# Patient Record
Sex: Male | Born: 1960 | Race: Black or African American | Hispanic: No | Marital: Married | Smoking: Never smoker
Health system: Southern US, Community
[De-identification: ages and names within clinical notes are randomized; demographics above are authoritative.]

## PROBLEM LIST (undated history)

## (undated) DIAGNOSIS — I10 Essential (primary) hypertension: Secondary | ICD-10-CM

## (undated) DIAGNOSIS — M1990 Unspecified osteoarthritis, unspecified site: Secondary | ICD-10-CM

## (undated) DIAGNOSIS — M109 Gout, unspecified: Secondary | ICD-10-CM

## (undated) DIAGNOSIS — E785 Hyperlipidemia, unspecified: Secondary | ICD-10-CM

## (undated) DIAGNOSIS — H409 Unspecified glaucoma: Secondary | ICD-10-CM

## (undated) HISTORY — DX: Essential (primary) hypertension: I10

## (undated) HISTORY — DX: Unspecified osteoarthritis, unspecified site: M19.90

## (undated) HISTORY — DX: Gout, unspecified: M10.9

## (undated) HISTORY — PX: COLONOSCOPY: SHX174

## (undated) HISTORY — DX: Unspecified glaucoma: H40.9

## (undated) HISTORY — PX: POLYPECTOMY: SHX149

## (undated) HISTORY — DX: Hyperlipidemia, unspecified: E78.5

---

## 2010-02-27 ENCOUNTER — Emergency Department (HOSPITAL_COMMUNITY)
Admission: EM | Admit: 2010-02-27 | Discharge: 2010-02-27 | Payer: Self-pay | Source: Home / Self Care | Admitting: Family Medicine

## 2010-11-20 ENCOUNTER — Encounter: Payer: Self-pay | Admitting: *Deleted

## 2010-11-20 ENCOUNTER — Emergency Department (HOSPITAL_BASED_OUTPATIENT_CLINIC_OR_DEPARTMENT_OTHER)
Admission: EM | Admit: 2010-11-20 | Discharge: 2010-11-20 | Disposition: A | Discharge status: Home / Self Care | Payer: 59 | Attending: Emergency Medicine | Admitting: Emergency Medicine

## 2010-11-20 ENCOUNTER — Emergency Department (INDEPENDENT_AMBULATORY_CARE_PROVIDER_SITE_OTHER): Payer: 59

## 2010-11-20 DIAGNOSIS — M25469 Effusion, unspecified knee: Secondary | ICD-10-CM | POA: Insufficient documentation

## 2010-11-20 DIAGNOSIS — M25569 Pain in unspecified knee: Secondary | ICD-10-CM

## 2010-11-20 DIAGNOSIS — M25462 Effusion, left knee: Secondary | ICD-10-CM

## 2010-11-20 LAB — SYNOVIAL CELL COUNT + DIFF, W/ CRYSTALS
Lymphocytes-Synovial Fld: 2 % (ref 0–20)
Monocyte-Macrophage-Synovial Fluid: 30 % — ABNORMAL LOW (ref 50–90)

## 2010-11-20 MED ORDER — IBUPROFEN 800 MG PO TABS
800.0000 mg | ORAL_TABLET | Freq: Once | ORAL | Status: AC
  Administered 2010-11-20: 800 mg via ORAL
  Filled 2010-11-20: qty 1

## 2010-11-20 MED ORDER — LIDOCAINE-EPINEPHRINE 2 %-1:100000 IJ SOLN
INTRAMUSCULAR | Status: AC
  Administered 2010-11-20: 02:00:00
  Filled 2010-11-20: qty 1

## 2010-11-20 MED ORDER — IBUPROFEN 800 MG PO TABS: 800.0000 mg | ORAL_TABLET | Freq: Three times a day (TID) | ORAL | Status: AC

## 2010-11-20 NOTE — ED Notes (Signed)
Sterile dressing applied to left knee per MD order, pt tolerated well, dressing clean, dry, and intact.

## 2010-11-20 NOTE — ED Notes (Signed)
MD at bedside. 

## 2010-11-20 NOTE — ED Provider Notes (Signed)
History     CSN: [id number] Arrival date & time: 11/20/2010  1:23 AM  Chief Complaint  Patient presents with  . Joint Swelling    (Consider location/radiation/quality/duration/timing/severity/associated sxs/prior treatment) Patient is a 50 y.o. male presenting with knee pain. The history is provided by the patient.  Knee Pain This is a recurrent problem. The current episode started 2 days ago. The problem occurs constantly. The problem has been gradually worsening. Pertinent negatives include no chest pain, no abdominal pain, no headaches and no shortness of breath. The symptoms are aggravated by walking. The symptoms are relieved by position and rest. He has tried nothing for the symptoms. The treatment provided no relief.   location his left knee. 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 No radiation of pain. Duration and timing of being a waxing and waning but seems to be worsening. Severity is moderate. No associated fevers, vomiting, redness or warmth. Left knee is more swollen. No calf pain or swelling. Patient states that over year ago he had similar symptoms and was diagnosed with gout. He has not had any recurrence of these symptoms until now. It does not hurt to touch his knee. No trauma twisting or fall.  History reviewed. No pertinent past medical history.  History reviewed. No pertinent past surgical history.  History reviewed. No pertinent family history.  History  Substance Use Topics  . Smoking status: Never Smoker   . Smokeless tobacco: Not on file  . Alcohol Use: No      Review of Systems  Constitutional: Negative for fever and chills.  HENT: Negative for neck pain and neck stiffness.   Eyes: Negative for pain.  Respiratory: Negative for shortness of breath.   Cardiovascular: Negative for chest pain.  Gastrointestinal: Negative for abdominal pain.  Genitourinary: Negative for  dysuria.  Musculoskeletal: Negative for back pain and gait problem.  Skin: Negative for rash.  Neurological: Negative for headaches.  All other systems reviewed and are negative.    Allergies  Review of patient's allergies indicates no known allergies.  Home Medications  No current outpatient prescriptions on file.  BP 143/78  Pulse 65  Temp(Src) 98.5 F (36.9 C) (Oral)  Resp 18  SpO2 99%  Physical Exam  Constitutional: He is oriented to person, place, and time. He appears well-developed and well-nourished.  HENT:  Head: Normocephalic and atraumatic.  Eyes: Conjunctivae and EOM are normal. Pupils are equal, round, and reactive to light.  Neck: Trachea normal. Neck supple. No thyromegaly present.  Cardiovascular: Normal rate, regular rhythm, S1 normal, S2 normal and normal pulses.     No systolic murmur is present   No diastolic murmur is present  Pulses:      Radial pulses are 2+ on the right side, and 2+ on the left side.  Pulmonary/Chest: Effort normal and breath sounds normal. He has no wheezes. He has no rhonchi. He has no rales. He exhibits no tenderness.  Abdominal: Soft. Normal appearance and bowel sounds are normal. There is no tenderness. There is no CVA tenderness and negative Murphy's sign.  Musculoskeletal:       Left lower extremity: Mild tenderness and moderate effusion to the knee joint. There is no erythema or increased warmth to touch. No posterior knee tenderness or swelling. No calf tenderness or cords. Negative Homans. Distal neurovascular is intact. Hip is nontender to palpation.  Neurological: He is alert  and oriented to person, place, and time. He has normal strength. No cranial nerve deficit or sensory deficit. GCS eye subscore is 4. GCS verbal subscore is 5. GCS motor subscore is 6.  Skin: Skin is warm and dry. No rash noted. He is not diaphoretic.  Psychiatric: His speech is normal.       Cooperative and appropriate    ED Course   ARTHOCENTESIS Date/Time: 11/20/2010 2:52 AM Performed by: Sunnie Nielsen Authorized by: Sunnie Nielsen Consent: Verbal consent obtained. Risks and benefits: risks, benefits and alternatives were discussed Consent given by: patient Patient understanding: patient states understanding of the procedure being performed Patient consent: the patient's understanding of the procedure matches consent given Procedure consent: procedure consent matches procedure scheduled Relevant documents: relevant documents present and verified Test results: test results available and properly labeled Site marked: the operative site was marked Imaging studies: imaging studies available Patient identity confirmed: verbally with patient Time out: Immediately prior to procedure a "time out" was called to verify the correct patient, procedure, equipment, support staff and site/side marked as required. Indications: joint swelling, pain and diagnostic evaluation  Body area: knee Joint: left knee Local anesthesia used: yes Anesthesia: local infiltration Local anesthetic: lidocaine 1% with epinephrine Anesthetic total: 2 ml Preparation: Patient was prepped and draped in the usual sterile fashion. Needle gauge: 22 G Approach: lateral Aspirate: yellow Aspirate amount: 25 ml Patient tolerance: Patient tolerated the procedure well with no immediate complications. Comments: Joint fluid sent to the lab for evaluation   (including critical care time)   Labs Reviewed  SYNOVIAL FLUID, CRYSTAL  GRAM STAIN  BODY FLUID CULTURE  BODY FLUID CELL COUNT WITH DIFFERENTIAL   Dg Knee Complete 4 Views Left  11/20/2010  *RADIOLOGY REPORT*  Clinical Data: Left knee pain and swelling.  History of gout.  LEFT KNEE - COMPLETE 4+ VIEW  Comparison: None.  Findings: Mild degenerative narrowing and hypertrophic changes in the medial compartment of the left knee.  Moderate sized left knee effusion.  No bone destruction. No evidence of acute  fracture or subluxation.  No focal bone lesions.  Bone matrix and cortex appear intact.  No abnormal radiopaque densities in the soft tissues.  IMPRESSION: Moderate sized effusion.  Mild medial compartment degeneration.  No acute bony abnormalities demonstrated.  Original Report Authenticated By: Marlon Pel, M.D.    At 4:44am, I spoke with the lab regarding gram stain with reported results: no cells, no organisms. Culture pending. WBC 1800. Crystal analysis not available.   MDM   L knee swelling h/o gout. L knee arthrocentesis as above. Xray reviewed and Ibuprofen for pain. PCP referral and Rx NSAIDs with joint infection precautions provided.        Sunnie Nielsen, MD 11/20/10 [id number]

## 2010-11-20 NOTE — ED Notes (Signed)
Supplies to bedside per MD order for joint aspiration

## 2010-11-20 NOTE — ED Notes (Signed)
Joint aspiration performed by Dr Dierdre Highman, pt tolerated well.

## 2010-11-20 NOTE — ED Notes (Signed)
Pt c/o left knee swelling, no known injury

## 2010-11-20 NOTE — ED Notes (Signed)
Pt c/o left knee swelling and difficulty bending x 1 week. Denies any injury, has had similar problem in past which required fluid to be drained off knee.

## 2010-11-23 LAB — BODY FLUID CULTURE
Culture: NO GROWTH
Gram Stain: NONE SEEN

## 2010-11-25 ENCOUNTER — Ambulatory Visit: Payer: 59 | Admitting: Family Medicine

## 2011-10-03 ENCOUNTER — Encounter (HOSPITAL_BASED_OUTPATIENT_CLINIC_OR_DEPARTMENT_OTHER): Payer: Self-pay | Admitting: *Deleted

## 2011-10-03 ENCOUNTER — Emergency Department (HOSPITAL_BASED_OUTPATIENT_CLINIC_OR_DEPARTMENT_OTHER): Payer: 59

## 2011-10-03 ENCOUNTER — Emergency Department (HOSPITAL_BASED_OUTPATIENT_CLINIC_OR_DEPARTMENT_OTHER)
Admission: EM | Admit: 2011-10-03 | Discharge: 2011-10-04 | Disposition: A | Discharge status: Home / Self Care | Payer: 59 | Attending: Emergency Medicine | Admitting: Emergency Medicine

## 2011-10-03 DIAGNOSIS — R069 Unspecified abnormalities of breathing: Secondary | ICD-10-CM | POA: Insufficient documentation

## 2011-10-03 DIAGNOSIS — R0789 Other chest pain: Secondary | ICD-10-CM

## 2011-10-03 DIAGNOSIS — R071 Chest pain on breathing: Secondary | ICD-10-CM | POA: Insufficient documentation

## 2011-10-03 LAB — TROPONIN I: Troponin I: <0.3 ng/mL (ref <0.30)

## 2011-10-03 MED ORDER — IBUPROFEN 400 MG PO TABS
600.0000 mg | ORAL_TABLET | Freq: Once | ORAL | Status: AC
  Administered 2011-10-03: 600 mg via ORAL
  Filled 2011-10-03: qty 1

## 2011-10-03 NOTE — ED Provider Notes (Signed)
History     CSN: [id number]  Arrival date & time 10/03/11  2209   First MD Initiated Contact with Patient 10/03/11 2302      Chief Complaint  Patient presents with  . Abdominal Pain    (Consider location/radiation/quality/duration/timing/severity/associated sxs/prior treatment) Patient is a 51 y.o. male presenting with abdominal pain. The history is provided by the patient.  Abdominal Pain The primary symptoms of the illness include abdominal pain. The primary symptoms of the illness do not include fever, shortness of breath, nausea, vomiting or diarrhea.  Symptoms associated with the illness do not include chills, constipation, hematuria or back pain.  pt c/o dull pain to left costal margin area for past day. Constant. Non radiating. Worse w palpation, sitting in certain position for long while, turning torso. No relation to activity or exertion. Not pleuritic. No assoc sob. No nv or diaphoresis. No other recent cp or similar pain to area even w activity or exertion. Occasional non productive cough. No fever or chills. No unusual doe or fatigue. No fall, or direct trauma to area, but states his work involves a lot of heavy lifting and he feels he may have strained something. No abd pain. No nv. Normal appetite. No gu c/o. No back/flank pain. Denies leg pain or swelling. No immobility, trauma, surgery, or ca hx. No dvt or pe hx. No pleuritic pain.   History reviewed. No pertinent past medical history.  History reviewed. No pertinent past surgical history.  No family history on file.  History  Substance Use Topics  . Smoking status: Never Smoker   . Smokeless tobacco: Not on file  . Alcohol Use: No      Review of Systems  Constitutional: Negative for fever and chills.  HENT: Negative for neck pain.   Eyes: Negative for redness.  Respiratory: Negative for shortness of breath.   Cardiovascular: Negative for chest pain and leg swelling.  Gastrointestinal: Positive for abdominal  pain. Negative for nausea, vomiting, diarrhea and constipation.  Genitourinary: Negative for hematuria and flank pain.  Musculoskeletal: Negative for back pain.  Skin: Negative for rash.  Neurological: Negative for headaches.  Hematological: Does not bruise/bleed easily.  Psychiatric/Behavioral: Negative for confusion.    Allergies  Review of patient's allergies indicates no known allergies.  Home Medications  No current outpatient prescriptions on file.  BP 154/90  Pulse 65  Temp 97.9 F (36.6 C) (Oral)  Resp 20  SpO2 99%  Physical Exam  Nursing note and vitals reviewed. Constitutional: He is oriented to person, place, and time. He appears well-developed and well-nourished. No distress.  HENT:  Head: Atraumatic.  Eyes: Conjunctivae are normal. No scleral icterus.  Neck: Neck supple. No tracheal deviation present.  Cardiovascular: Normal rate, regular rhythm, normal heart sounds and intact distal pulses.   Pulmonary/Chest: Effort normal and breath sounds normal. No accessory muscle usage. No respiratory distress. He exhibits tenderness.       Left lower chest wall tenderness reproducing symptoms, no crepitus.   Abdominal: Soft. Bowel sounds are normal. He exhibits no distension and no mass. There is no tenderness. There is no rebound and no guarding.  Genitourinary:       No cva tenderness  Musculoskeletal: Normal range of motion. He exhibits no edema and no tenderness.  Neurological: He is alert and oriented to person, place, and time.  Skin: Skin is warm and dry.  Psychiatric: He has a normal mood and affect.    ED Course  Procedures (including critical care  time)   Labs Reviewed  TROPONIN I    Results for orders placed during the hospital encounter of 10/03/11  TROPONIN I      Component Value Range   Troponin I <0.30  <0.30 ng/mL   Dg Chest 2 View  10/03/2011  *RADIOLOGY REPORT*  Clinical Data: Chest pain.  CHEST - 2 VIEW  Comparison: None.  Findings:  Cardiomediastinal silhouette appears normal.  No acute pulmonary disease is noted.  Bony thorax is intact.  IMPRESSION: No acute cardiopulmonary abnormality seen.  Original Report Authenticated By: Venita Sheffield., M.D.      MDM  Cxr. Labs. Motrin po.      Date: 10/03/2011  Rate: 60  Rhythm: normal sinus rhythm  QRS Axis: normal  Intervals: normal  ST/T Wave abnormalities: nonspecific T wave changes  Conduction Disutrbances:none  Narrative Interpretation:   Old EKG Reviewed: none available  Pain seems musculoskeletal in nature. Pt feels as if may have strained/pulled muscle.  After having constant symptoms x > 24 hours, trop negative.    Will rx pain, pcp f/u.     Suzi Roots, MD 10/04/11 Marlyne Beards

## 2011-10-03 NOTE — ED Notes (Signed)
Pain in his left upper abdominal quadrant. States he thinks he pulled a muscle at work.

## 2011-10-04 MED ORDER — IBUPROFEN 600 MG PO TABS: 600.0000 mg | ORAL_TABLET | Freq: Four times a day (QID) | ORAL | Status: AC | PRN

## 2011-10-04 MED ORDER — HYDROCODONE-ACETAMINOPHEN 5-500 MG PO TABS: 1.0000 | ORAL_TABLET | Freq: Four times a day (QID) | ORAL | Status: AC | PRN

## 2012-04-01 ENCOUNTER — Ambulatory Visit: Payer: 59 | Admitting: Internal Medicine

## 2012-04-30 ENCOUNTER — Ambulatory Visit (INDEPENDENT_AMBULATORY_CARE_PROVIDER_SITE_OTHER): Payer: 59 | Admitting: Family Medicine

## 2012-04-30 ENCOUNTER — Encounter: Payer: Self-pay | Admitting: Family Medicine

## 2012-04-30 VITALS — BP 148/98 | HR 80 | Temp 98.0°F | Resp 12 | Ht 74.75 in | Wt 316.0 lb

## 2012-04-30 DIAGNOSIS — I10 Essential (primary) hypertension: Secondary | ICD-10-CM

## 2012-04-30 DIAGNOSIS — Z23 Encounter for immunization: Secondary | ICD-10-CM

## 2012-04-30 DIAGNOSIS — E785 Hyperlipidemia, unspecified: Secondary | ICD-10-CM | POA: Insufficient documentation

## 2012-04-30 DIAGNOSIS — M109 Gout, unspecified: Secondary | ICD-10-CM

## 2012-04-30 DIAGNOSIS — Z0000 Encounter for general adult medical examination without abnormal findings: Secondary | ICD-10-CM

## 2012-04-30 LAB — BASIC METABOLIC PANEL
Calcium: 8.7 mg/dL (ref 8.4–10.5)
GFR: 92.45 mL/min (ref >60.00)
Glucose, Bld: 95 mg/dL (ref 70–99)
Sodium: 142 mEq/L (ref 135–145)

## 2012-04-30 LAB — HEPATIC FUNCTION PANEL
ALT: 25 U/L (ref 0–53)
Albumin: 3.8 g/dL (ref 3.5–5.2)
Total Bilirubin: 0.8 mg/dL (ref 0.3–1.2)
Total Protein: 7 g/dL (ref 6.0–8.3)

## 2012-04-30 LAB — CBC WITH DIFFERENTIAL/PLATELET
Basophils Relative: 0.5 % (ref 0.0–3.0)
Eosinophils Absolute: 0.1 10*3/uL (ref 0.0–0.7)
HCT: 42.2 % (ref 39.0–52.0)
Lymphs Abs: 2.2 10*3/uL (ref 0.7–4.0)
MCHC: 33.1 g/dL (ref 30.0–36.0)
MCV: 86.3 fl (ref 78.0–100.0)
Monocytes Absolute: 0.6 10*3/uL (ref 0.1–1.0)
Neutro Abs: 2.4 10*3/uL (ref 1.4–7.7)
Neutrophils Relative %: 44.8 % (ref 43.0–77.0)
RBC: 4.89 Mil/uL (ref 4.22–5.81)

## 2012-04-30 LAB — PSA: PSA: 0.96 ng/mL (ref 0.10–4.00)

## 2012-04-30 LAB — LIPID PANEL: HDL: 41.5 mg/dL (ref >39.00)

## 2012-04-30 MED ORDER — AMLODIPINE BESYLATE 5 MG PO TABS: 5.0000 mg | ORAL_TABLET | Freq: Every day | ORAL | Status: DC

## 2012-04-30 NOTE — Patient Instructions (Addendum)

## 2012-04-30 NOTE — Progress Notes (Signed)
  Subjective:    Patient ID: Eric Mann, male    DOB: 07-Dec-1960, 52 y.o.   MRN: [id number]  HPI New patient.  No primary care and several years. History of gout which is been treated sporadically. At one point was on prophylaxis with allopurinol. Generally has about 3 episodes per year. Previous episodes have involved knee and hands  Patient currently takes no medications. Previously was treated for hypertension. Cannot recall medication. No history of screening colonoscopy. No lab work in several years  No prior surgeries. No known allergies. Family history significant for several members with diabetes. Mother had heart disease but he is not sure of the extent. Mother also had hypertension. Father died of accidental death early in life  Patient has 10 children. He is married. Nonsmoker. No alcohol use    Review of Systems  Constitutional: Negative for fever, activity change, appetite change and fatigue.  HENT: Negative for ear pain, congestion and trouble swallowing.   Eyes: Negative for pain and visual disturbance.  Respiratory: Negative for cough, shortness of breath and wheezing.   Cardiovascular: Negative for chest pain and palpitations.  Gastrointestinal: Negative for nausea, vomiting, abdominal pain, diarrhea, constipation, blood in stool, abdominal distention and rectal pain.  Genitourinary: Negative for dysuria, hematuria, discharge and testicular pain.  Musculoskeletal: Positive for arthralgias. Negative for joint swelling.  Skin: Negative for rash.  Neurological: Negative for dizziness, syncope and headaches.  Hematological: Negative for adenopathy.  Psychiatric/Behavioral: Negative for confusion and dysphoric mood.       Objective:   Physical Exam  Constitutional: He is oriented to person, place, and time. He appears well-developed and well-nourished. No distress.  HENT:  Head: Normocephalic and atraumatic.  Right Ear: External ear normal.  Left Ear:  External ear normal.  Mouth/Throat: Oropharynx is clear and moist.  Eyes: Conjunctivae and EOM are normal. Pupils are equal, round, and reactive to light.  Neck: Normal range of motion. Neck supple. No thyromegaly present.  Cardiovascular: Normal rate, regular rhythm and normal heart sounds.   No murmur heard. Pulmonary/Chest: No respiratory distress. He has no wheezes. He has no rales.  Abdominal: Soft. Bowel sounds are normal. He exhibits no distension and no mass. There is no tenderness. There is no rebound and no guarding.  Genitourinary: Rectum normal and prostate normal.  Musculoskeletal: He exhibits no edema.  Lymphadenopathy:    He has no cervical adenopathy.  Neurological: He is alert and oriented to person, place, and time. He displays normal reflexes. No cranial nerve deficit.  Skin: No rash noted.  Psychiatric: He has a normal mood and affect.          Assessment & Plan:  Complete physical. Obtain screening labs. Tetanus booster given. Set up screening colonoscopy. Discussed importance of regular exercise and weight loss. Needs glucose screening with very strong family history of diabetes  Hypertension. Currently untreated. Start amlodipine 5 mg daily and reassess one month

## 2012-05-03 ENCOUNTER — Encounter: Payer: Self-pay | Admitting: Family Medicine

## 2012-05-03 LAB — LDL CHOLESTEROL, DIRECT: Direct LDL: 145.2 mg/dL

## 2012-05-05 ENCOUNTER — Encounter: Payer: Self-pay | Admitting: Internal Medicine

## 2012-05-17 ENCOUNTER — Ambulatory Visit: Payer: 59 | Admitting: Internal Medicine

## 2012-05-28 ENCOUNTER — Ambulatory Visit (INDEPENDENT_AMBULATORY_CARE_PROVIDER_SITE_OTHER): Payer: 59 | Admitting: Family Medicine

## 2012-05-28 ENCOUNTER — Encounter: Payer: Self-pay | Admitting: Family Medicine

## 2012-05-28 VITALS — BP 122/80 | Temp 97.8°F | Wt 313.0 lb

## 2012-05-28 DIAGNOSIS — I10 Essential (primary) hypertension: Secondary | ICD-10-CM

## 2012-05-28 NOTE — Progress Notes (Signed)
  Subjective:    Patient ID: Eric Mann, male    DOB: [birth year], 52 y.o.   MRN: [id number]  HPI   Followup hypertension We started amlodipine last visit. Tolerating well no side effects. Blood pressure was 148/98 last visit. He has made some dietary changes and has lost about 3 or 4 pounds. Compliant with therapy.  Trying to watch sodium intake carefully. No alcohol consumption. No regular nonsteroidals.  Past Medical History  Diagnosis Date  . Gout   . Hypertension   . Gout    No past surgical history on file.  reports that he has never smoked. He does not have any smokeless tobacco history on file. He reports that he does not drink alcohol or use illicit drugs. family history includes Cancer in his sister; Diabetes in his brother, mother, and sister; Heart disease in his mother; and Hypertension in his mother. No Known Allergies    Review of Systems  Constitutional: Negative for fatigue.  Eyes: Negative for visual disturbance.  Respiratory: Negative for cough, chest tightness and shortness of breath.   Cardiovascular: Negative for chest pain, palpitations and leg swelling.  Neurological: Negative for dizziness, syncope, weakness, light-headedness and headaches.       Objective:   Physical Exam  Constitutional: He appears well-developed and well-nourished.  Neck: Neck supple. No thyromegaly present.  Cardiovascular: Normal rate and regular rhythm.   Pulmonary/Chest: Effort normal and breath sounds normal. No respiratory distress. He has no wheezes. He has no rales.  Musculoskeletal: He exhibits no edema.          Assessment & Plan:  Hypertension. Improved. Continue amlodipine. Routine followup 6 months. Continue exercise and weight loss efforts.

## 2012-06-02 ENCOUNTER — Encounter: Payer: Self-pay | Admitting: Internal Medicine

## 2012-06-02 ENCOUNTER — Ambulatory Visit (AMBULATORY_SURGERY_CENTER): Payer: 59 | Admitting: *Deleted

## 2012-06-02 VITALS — Ht 75.0 in | Wt 313.8 lb

## 2012-06-02 DIAGNOSIS — Z1211 Encounter for screening for malignant neoplasm of colon: Secondary | ICD-10-CM

## 2012-06-02 MED ORDER — MOVIPREP 100 G PO SOLR: 1.0000 | Freq: Once | ORAL | Status: DC

## 2012-06-02 NOTE — Progress Notes (Signed)
Denies any allergies to eggs or soy products. Denies any complications with sedation or anesthesia.

## 2012-06-16 ENCOUNTER — Ambulatory Visit (AMBULATORY_SURGERY_CENTER): Payer: 59 | Admitting: Internal Medicine

## 2012-06-16 ENCOUNTER — Encounter: Payer: Self-pay | Admitting: Internal Medicine

## 2012-06-16 VITALS — BP 122/78 | HR 54 | Temp 96.4°F | Resp 22 | Ht 75.0 in | Wt 313.0 lb

## 2012-06-16 DIAGNOSIS — D126 Benign neoplasm of colon, unspecified: Secondary | ICD-10-CM

## 2012-06-16 DIAGNOSIS — Z1211 Encounter for screening for malignant neoplasm of colon: Secondary | ICD-10-CM

## 2012-06-16 MED ORDER — SODIUM CHLORIDE 0.9 % IV SOLN: 500.0000 mL | INTRAVENOUS | Status: DC

## 2012-06-16 NOTE — Op Note (Signed)
[facility] [address] [place], [address]   COLONOSCOPY PROCEDURE REPORT  PATIENT: Eric Mann, Eric Mann  MR#: [id number] BIRTHDATE: April 09, 1960 , 51  yrs. old GENDER: Male ENDOSCOPIST: Roxy Cedar, MD REFERRED XB:JYNWG Burchette, M.D. PROCEDURE DATE:  06/16/2012 PROCEDURE:   Colonoscopy with snare polypectomy    x 3 ASA CLASS:   Class II INDICATIONS:average risk screening. MEDICATIONS: MAC sedation, administered by CRNA and propofol (Diprivan) 500mg  IV  DESCRIPTION OF PROCEDURE:   After the risks benefits and alternatives of the procedure were thoroughly explained, informed consent was obtained.  A digital rectal exam revealed no abnormalities of the rectum.   The LB [id number]  endoscope was introduced through the anus and advanced to the cecum, which was identified by both the appendix and ileocecal valve. No adverse events experienced.   The quality of the prep was excellent, using MoviPrep  The instrument was then slowly withdrawn as the colon was fully examined.      COLON FINDINGS: Three diminutive polyps were found in the ascending (2) and transverse colon.  A polypectomy was performed with a cold snare.  The resection was complete and the polyp tissue was completely retrieved.   The colon mucosa was otherwise normal. Retroflexed views revealed no abnormalities. The time to cecum=2 minutes 18 seconds.  Withdrawal time=19 minutes 08 seconds.  The scope was withdrawn and the procedure completed. COMPLICATIONS: There were no complications.  ENDOSCOPIC IMPRESSION: 1.   Three diminutive polyps were found in the ascending (2) and transverse colon; polypectomy was performed with a cold snare 2.   The colon mucosa was otherwise normal  RECOMMENDATIONS: 1. Follow up colonoscopy in 5 years   eSigned:  Roxy Cedar, MD 06/16/2012 8:47 AM   cc: Evelena Peat, MD and The Patient   PATIENT NAME:  Eric Mann, Eric Mann MR#: [id number]

## 2012-06-16 NOTE — Progress Notes (Addendum)
Patient did not have preoperative order for IV antibiotic SSI prophylaxis. (G8918)  Patient did not experience any of the following events: a burn prior to discharge; a fall within the facility; wrong site/side/patient/procedure/implant event; or a hospital transfer or hospital admission upon discharge from the facility. (G8907)  

## 2012-06-16 NOTE — Patient Instructions (Addendum)
YOU HAD AN ENDOSCOPIC PROCEDURE TODAY AT THE [facility]: Refer to the procedure report that was given to you for any specific questions about what was found during the examination.  If the procedure report does not answer your questions, please call your gastroenterologist to clarify.  If you requested that your care partner not be given the details of your procedure findings, then the procedure report has been included in a sealed envelope for you to review at your convenience later.  YOU SHOULD EXPECT: Some feelings of bloating in the abdomen. Passage of more gas than usual.  Walking can help get rid of the air that was put into your GI tract during the procedure and reduce the bloating. If you had a lower endoscopy (such as a colonoscopy or flexible sigmoidoscopy) you may notice spotting of blood in your stool or on the toilet paper. If you underwent a bowel prep for your procedure, then you may not have a normal bowel movement for a few days.  DIET: Your first meal following the procedure should be a light meal and then it is ok to progress to your normal diet.  A half-sandwich or bowl of soup is an example of a good first meal.  Heavy or fried foods are harder to digest and may make you feel nauseous or bloated.  Likewise meals heavy in dairy and vegetables can cause extra gas to form and this can also increase the bloating.  Drink plenty of fluids but you should avoid alcoholic beverages for 24 hours.  ACTIVITY: Your care partner should take you home directly after the procedure.  You should plan to take it easy, moving slowly for the rest of the day.  You can resume normal activity the day after the procedure however you should NOT DRIVE or use heavy machinery for 24 hours (because of the sedation medicines used during the test).    SYMPTOMS TO REPORT IMMEDIATELY: A gastroenterologist can be reached at any hour.  During normal business hours, 8:30 AM to 5:00 PM Monday through Friday,  call [phone].  After hours and on weekends, please call the GI answering service at [phone] who will take a message and have the physician on call contact you.   Following lower endoscopy (colonoscopy or flexible sigmoidoscopy):  Excessive amounts of blood in the stool  Significant tenderness or worsening of abdominal pains  Swelling of the abdomen that is new, acute  Fever of 100F or higher FOLLOW UP: If any biopsies were taken you will be contacted by phone or by letter within the next 1-3 weeks.  Call your gastroenterologist if you have not heard about the biopsies in 3 weeks.  Our staff will call the home number listed on your records the next business day following your procedure to check on you and address any questions or concerns that you may have at that time regarding the information given to you following your procedure. This is a courtesy call and so if there is no answer at the home number and we have not heard from you through the emergency physician on call, we will assume that you have returned to your regular daily activities without incident.  YOUR NEXT COLONOSCOPY SHOULD BE IN 5 YRS.  SIGNATURES/CONFIDENTIALITY: You and/or your care partner have signed paperwork which will be entered into your electronic medical record.  These signatures attest to the fact that that the information above on your After Visit Summary has been reviewed and is understood.  Full responsibility of the confidentiality of this discharge information lies with you and/or your care-partner.

## 2012-06-17 ENCOUNTER — Telehealth: Payer: Self-pay | Admitting: *Deleted

## 2012-06-17 NOTE — Telephone Encounter (Signed)
  Follow up Call-  Call back number 06/16/2012  Post procedure Call Back phone  # cell [phone]  Permission to leave phone message Yes     Patient questions:  Do you have a fever, pain , or abdominal swelling? no Pain Score  0 *  Have you tolerated food without any problems? yes  Have you been able to return to your normal activities? yes  Do you have any questions about your discharge instructions: Diet   no Medications  no Follow up visit  no  Do you have questions or concerns about your Care? no  Actions: * If pain score is 4 or above: No action needed, pain <4.

## 2012-06-23 ENCOUNTER — Encounter: Payer: Self-pay | Admitting: Internal Medicine

## 2012-09-24 ENCOUNTER — Encounter: Payer: Self-pay | Admitting: Family Medicine

## 2012-09-24 ENCOUNTER — Emergency Department (HOSPITAL_COMMUNITY)
Admission: EM | Admit: 2012-09-24 | Discharge: 2012-09-24 | Disposition: A | Discharge status: Home / Self Care | Payer: 59 | Attending: Emergency Medicine | Admitting: Emergency Medicine

## 2012-09-24 ENCOUNTER — Encounter (HOSPITAL_COMMUNITY): Payer: Self-pay | Admitting: Emergency Medicine

## 2012-09-24 ENCOUNTER — Emergency Department (HOSPITAL_COMMUNITY): Payer: 59

## 2012-09-24 ENCOUNTER — Ambulatory Visit (INDEPENDENT_AMBULATORY_CARE_PROVIDER_SITE_OTHER): Payer: 59 | Admitting: Family Medicine

## 2012-09-24 VITALS — BP 120/84 | Temp 99.0°F | Wt 311.0 lb

## 2012-09-24 DIAGNOSIS — M25522 Pain in left elbow: Secondary | ICD-10-CM

## 2012-09-24 DIAGNOSIS — I10 Essential (primary) hypertension: Secondary | ICD-10-CM | POA: Insufficient documentation

## 2012-09-24 DIAGNOSIS — R110 Nausea: Secondary | ICD-10-CM

## 2012-09-24 DIAGNOSIS — R5381 Other malaise: Secondary | ICD-10-CM

## 2012-09-24 DIAGNOSIS — M25529 Pain in unspecified elbow: Secondary | ICD-10-CM

## 2012-09-24 DIAGNOSIS — M25429 Effusion, unspecified elbow: Secondary | ICD-10-CM | POA: Insufficient documentation

## 2012-09-24 DIAGNOSIS — R509 Fever, unspecified: Secondary | ICD-10-CM | POA: Insufficient documentation

## 2012-09-24 DIAGNOSIS — M109 Gout, unspecified: Secondary | ICD-10-CM | POA: Insufficient documentation

## 2012-09-24 LAB — CBC WITH DIFFERENTIAL/PLATELET
Eosinophils Absolute: 0 10*3/uL (ref 0.0–0.7)
Eosinophils Relative: 0 % (ref 0–5)
HCT: 38.4 % — ABNORMAL LOW (ref 39.0–52.0)
Hemoglobin: 13.6 g/dL (ref 13.0–17.0)
Lymphocytes Relative: 14 % (ref 12–46)
Lymphs Abs: 1.4 10*3/uL (ref 0.7–4.0)
MCH: 29.6 pg (ref 26.0–34.0)
MCV: 83.7 fL (ref 78.0–100.0)
Monocytes Absolute: 1.2 10*3/uL — ABNORMAL HIGH (ref 0.1–1.0)
Monocytes Relative: 12 % (ref 3–12)
Platelets: 169 10*3/uL (ref 150–400)
RBC: 4.59 MIL/uL (ref 4.22–5.81)
WBC: 10.1 10*3/uL (ref 4.0–10.5)

## 2012-09-24 LAB — SYNOVIAL CELL COUNT + DIFF, W/ CRYSTALS
Crystals, Fluid: POSITIVE
Lymphocytes-Synovial Fld: 0 % (ref 0–20)
Monocyte-Macrophage-Synovial Fluid: 10 % — ABNORMAL LOW (ref 50–90)
WBC, Synovial: 124918 /mm3 — ABNORMAL HIGH (ref 0–200)

## 2012-09-24 LAB — BASIC METABOLIC PANEL
BUN: 11 mg/dL (ref 6–23)
CO2: 24 mEq/L (ref 19–32)
Calcium: 8.7 mg/dL (ref 8.4–10.5)
Glucose, Bld: 111 mg/dL — ABNORMAL HIGH (ref 70–99)
Sodium: 139 mEq/L (ref 135–145)

## 2012-09-24 LAB — GRAM STAIN

## 2012-09-24 MED ORDER — OXYCODONE-ACETAMINOPHEN 5-325 MG PO TABS: 2.0000 | ORAL_TABLET | ORAL | Status: DC | PRN

## 2012-09-24 MED ORDER — ONDANSETRON HCL 4 MG/2ML IJ SOLN
4.0000 mg | Freq: Once | INTRAMUSCULAR | Status: AC
  Administered 2012-09-24: 4 mg via INTRAVENOUS
  Filled 2012-09-24: qty 2

## 2012-09-24 MED ORDER — PREDNISONE 20 MG PO TABS: ORAL_TABLET | ORAL | Status: DC

## 2012-09-24 MED ORDER — MORPHINE SULFATE 4 MG/ML IJ SOLN
4.0000 mg | Freq: Once | INTRAMUSCULAR | Status: AC
  Administered 2012-09-24: 4 mg via INTRAVENOUS
  Filled 2012-09-24: qty 1

## 2012-09-24 MED ORDER — METHYLPREDNISOLONE SODIUM SUCC 125 MG IJ SOLR
125.0000 mg | Freq: Once | INTRAMUSCULAR | Status: AC
  Administered 2012-09-24: 125 mg via INTRAVENOUS
  Filled 2012-09-24: qty 2

## 2012-09-24 NOTE — ED Notes (Signed)
Lab at beside.

## 2012-09-24 NOTE — ED Provider Notes (Signed)
CSN: [id number]     Arrival date & time 09/24/12  6578 History     First MD Initiated Contact with Patient 09/24/12 [phone]     No chief complaint on file.  (Consider location/radiation/quality/duration/timing/severity/associated sxs/prior Treatment) HPI Comments: Patient presents to the ER for evaluation of moderate pain and swelling of his left elbow. Patient reports that the symptoms began 2 or 3 days ago. Initially it was a small amount of pain, but the pain continually worsened. He denies injury to the area. Last night he had a fever. He saw his doctor today and was sent to the ER to rule out infection in the joint. Patient has not had any other symptoms such as upper respiratory infection symptoms or abdominal symptoms to explain fever.   Past Medical History  Diagnosis Date  . Gout   . Hypertension   . Gout    No past surgical history on file. Family History  Problem Relation Age of Onset  . Heart disease Mother   . Hypertension Mother   . Diabetes Mother   . Diabetes Sister   . Cancer Sister     cervix cancer  . Diabetes Brother   . Colon cancer Neg Hx   . Esophageal cancer Neg Hx   . Rectal cancer Neg Hx   . Stomach cancer Neg Hx    History  Substance Use Topics  . Smoking status: Never Smoker   . Smokeless tobacco: Never Used  . Alcohol Use: No    Review of Systems  Constitutional: Positive for fever.  Musculoskeletal: Positive for joint swelling.  All other systems reviewed and are negative.    Allergies  Review of patient's allergies indicates no known allergies.  Home Medications   Current Outpatient Rx  Name  Route  Sig  Dispense  Refill  . acetaminophen (TYLENOL) 500 MG tablet   Oral   Take 1,000 mg by mouth every 6 (six) hours as needed for pain.         Marland Kitchen amLODipine (NORVASC) 5 MG tablet   Oral   Take 1 tablet (5 mg total) by mouth daily.   30 tablet   11    BP 137/88  Pulse 79  Temp(Src) 98.5 F (36.9 C) (Oral)  Resp 20  SpO2  98% Physical Exam  Constitutional: He is oriented to person, place, and time. He appears well-developed and well-nourished. No distress.  HENT:  Head: Normocephalic and atraumatic.  Right Ear: Hearing normal.  Left Ear: Hearing normal.  Nose: Nose normal.  Mouth/Throat: Oropharynx is clear and moist and mucous membranes are normal.  Eyes: Conjunctivae and EOM are normal. Pupils are equal, round, and reactive to light.  Neck: Normal range of motion. Neck supple.  Cardiovascular: Regular rhythm, S1 normal and S2 normal.  Exam reveals no gallop and no friction rub.   No murmur heard. Pulmonary/Chest: Effort normal and breath sounds normal. No respiratory distress. He exhibits no tenderness.  Abdominal: Soft. Normal appearance and bowel sounds are normal. There is no hepatosplenomegaly. There is no tenderness. There is no rebound, no guarding, no tenderness at McBurney's point and negative Murphy's sign. No hernia.  Musculoskeletal: Normal range of motion.       Left elbow: He exhibits swelling and effusion. He exhibits normal range of motion and no deformity. Tenderness (diffuse) found.  Neurological: He is alert and oriented to person, place, and time. He has normal strength. No cranial nerve deficit or sensory deficit. Coordination normal. GCS eye subscore  is 4. GCS verbal subscore is 5. GCS motor subscore is 6.  Skin: Skin is warm, dry and intact. No rash noted. No cyanosis.  Psychiatric: He has a normal mood and affect. His speech is normal and behavior is normal. Thought content normal.    ED Course   Procedures (including critical care time)  Procedure: Arthrocentesis The elbow was positioned at 90 degrees after landmarks were identified and the area was prepped with Betadine and sterilely draped. 1% lidocaine was injected under the skin to raise a large skin wheal for skin anesthesia. An 21-gauge needle was then advanced through the anesthetized skin with constant aspiration on a 10mL  syringe. The needle was advanced into the joint capsule and synovial fluid was aspirated until there was no more return. 2 mL of cloudy fluid was removed. The needle was removed and a sterile dressing was held in place until bleeding stopped, and the Band-Aid was placed over the site. The patient tolerated this procedure well. There were no complications.  Labs Reviewed  CBC WITH DIFFERENTIAL - Abnormal; Notable for the following:    HCT 38.4 (*)    Monocytes Absolute 1.2 (*)    All other components within normal limits  BASIC METABOLIC PANEL - Abnormal; Notable for the following:    Potassium 3.1 (*)    Glucose, Bld 111 (*)    GFR calc non Af Amer 75 (*)    GFR calc Af Amer 87 (*)    All other components within normal limits  CELL COUNT + DIFF,  W/ CRYST-SYNVL FLD - Abnormal; Notable for the following:    Appearance-Synovial TURBID (*)    WBC, Synovial 124918 (*)    Neutrophil, Synovial 90 (*)    Monocyte-Macrophage-Synovial Fluid 10 (*)    All other components within normal limits  GRAM STAIN  CULTURE, BLOOD (ROUTINE X 2)  CULTURE, BLOOD (ROUTINE X 2)  BODY FLUID CULTURE  SEDIMENTATION RATE  C-REACTIVE PROTEIN   Dg Elbow Complete Left  09/24/2012   *RADIOLOGY REPORT*  Clinical Data: Pain with swelling and redness; fever  LEFT ELBOW - COMPLETE 3+ VIEW  Comparison: None.  Findings: Frontal, lateral, bilateral oblique views were obtained. There is a sizable joint effusion.  There is no demonstrable fracture or dislocation.  There is a prominent spur arising from the olecranon.  There is no appreciable joint space narrowing.  No erosive change or bony destruction.  IMPRESSION: Sizable joint effusion.  In the absence of trauma, this finding could indicate inflammatory etiology. No erosive change or bony destruction is seen.  Correlation with arthrocentesis may be reasonable if there is infection suspected clinically. Alternatively, MR may be helpful to assess for marrow edema or possibly  subtle bony destruction not seen on radiographic examination.  No fracture or dislocation identified.  There is a prominent olecranon spur.   Original Report Authenticated By: Bretta Bang, M.D.   Diagnosis: Gout  MDM  Patient presents to the ER for evaluation of this pain and swelling of the left elbow. Patient reports a history of gout, but the pain he is experiencing elbow is different. His has had low-grade fever last night. This raises concern for infectious etiology. I didn't have via joint effusion on exam. X-ray confirmed. I did recommend arthrocentesis for diagnostic purposes. Patient has had this done as he before, understands the procedure. He did give consent. Procedure was done without difficulty. Review of the synovial fluid reveals crystals consistent with gout.  Gilda Crease, MD 09/24/12  1149 

## 2012-09-24 NOTE — Progress Notes (Signed)
Chief Complaint  Patient presents with  . left elbow pain    HPI:  Acute visit for L elbow pain: -started 2 days ago -symptoms: pain, swelling, redness in L elbow, also has felt feverish, HA - felt hot last night and per wife had a high fever, has been having nausea, malaise and feel quite sick this morning -has had gout in the past, but usually does not feel sick with gout and reports felt sicker last night and feels sick today -feels like can not bend L elbow due to swelling and pain  ROS: See pertinent positives and negatives per HPI.  Past Medical History  Diagnosis Date  . Gout   . Hypertension   . Gout     Family History  Problem Relation Age of Onset  . Heart disease Mother   . Hypertension Mother   . Diabetes Mother   . Diabetes Sister   . Cancer Sister     cervix cancer  . Diabetes Brother   . Colon cancer Neg Hx   . Esophageal cancer Neg Hx   . Rectal cancer Neg Hx   . Stomach cancer Neg Hx     History   Social History  . Marital Status: Married    Spouse Name: N/A    Number of Children: N/A  . Years of Education: N/A   Social History Main Topics  . Smoking status: Never Smoker   . Smokeless tobacco: Never Used  . Alcohol Use: No  . Drug Use: No  . Sexually Active: None   Other Topics Concern  . None   Social History Narrative  . None    Current outpatient prescriptions:amLODipine (NORVASC) 5 MG tablet, Take 1 tablet (5 mg total) by mouth daily., Disp: 30 tablet, Rfl: 11  EXAM:  Filed Vitals:   09/24/12 0757  BP: 120/84  Temp: 99 F (37.2 C)    Body mass index is 38.87 kg/(m^2).  GENERAL: vitals reviewed and listed above, alert, oriented, appears well hydrated and in no acute distress  HEENT: atraumatic, conjunttiva clear, no obvious abnormalities on inspection of external nose and ears  NECK: no obvious masses on inspection  LUNGS: clear to auscultation bilaterally, no wheezes, rales or rhonchi, good air movement  CV: HRRR, no  peripheral edema  MS: L elbow with effusion and diffuse TTP - pt refuses to move this elbow  PSYCH: pleasant and cooperative, no obvious depression or anxiety  ASSESSMENT AND PLAN:  Discussed the following assessment and plan:  Elbow pain, left  Malaise  Nausea  Fever  -pt with severe elbow pain, swelling and warmth, refusing to move elbow with low grade fever here and systemic symptoms of fever, malaise, HA, nausea. While he has a hx of possible gout and this is a possible etiology discussed possibility of septic joint. Advised to go to emergency room for urgent eval, tap, labs, tx if septic. ED notified. -Patient advised to return or notify a doctor immediately if symptoms worsen or persist or new concerns arise.  There are no Patient Instructions on file for this visit.   Kriste Basque R.

## 2012-09-24 NOTE — ED Notes (Signed)
Pt returned from radiology.

## 2012-09-24 NOTE — ED Notes (Signed)
Pt sts he was sent over from his PCP office for possible infection in left elbow. sts he has hx of gout but this time he has a fever with it. Pt in nad, skin warm and dry, resp e/u. Pt has swelling to left elbow.

## 2012-09-27 LAB — BODY FLUID CULTURE: Culture: NO GROWTH

## 2012-09-30 LAB — CULTURE, BLOOD (ROUTINE X 2)
Culture: NO GROWTH
Culture: NO GROWTH

## 2012-11-03 ENCOUNTER — Encounter: Payer: Self-pay | Admitting: Family Medicine

## 2012-11-03 ENCOUNTER — Ambulatory Visit (INDEPENDENT_AMBULATORY_CARE_PROVIDER_SITE_OTHER): Payer: 59 | Admitting: Family Medicine

## 2012-11-03 VITALS — BP 130/90 | HR 58 | Wt 312.0 lb

## 2012-11-03 DIAGNOSIS — M109 Gout, unspecified: Secondary | ICD-10-CM

## 2012-11-03 MED ORDER — COLCHICINE 0.6 MG PO TABS: 0.6000 mg | ORAL_TABLET | Freq: Every day | ORAL | Status: DC

## 2012-11-03 MED ORDER — PREDNISONE 20 MG PO TABS: ORAL_TABLET | ORAL | Status: DC

## 2012-11-03 MED ORDER — ALLOPURINOL 100 MG PO TABS: ORAL_TABLET | ORAL | Status: DC

## 2012-11-03 NOTE — Progress Notes (Signed)
  Subjective:    Patient ID: Eric Mann, male    DOB: [birth year], 52 y.o.   MRN: [id number]  HPI  Patient is seen with possible gout involving both hands He has a long history of known gout and had recent flare involving left elbow. This was drained and did confirm crystals He was treated with prednisone and symptoms promptly resolve. He's had involvement of multiple areas including knees, feet, elbows, and hands. No alcohol use. No hydrochlorothiazide use. No clear dietary triggers. No known family history.  Current involvement is both hands. He has not noted any warmth or redness but has diffuse pain. Patient previously took allopurinol which seemed to help but he ran out of insurance and stopped for that reason.  Past Medical History  Diagnosis Date  . Gout   . Hypertension   . Gout    No past surgical history on file.  reports that he has never smoked. He has never used smokeless tobacco. He reports that he does not drink alcohol or use illicit drugs. family history includes Cancer in his sister; Diabetes in his brother, mother, and sister; Heart disease in his mother; Hypertension in his mother. There is no history of Colon cancer, Esophageal cancer, Rectal cancer, or Stomach cancer. No Known Allergies   Review of Systems  Constitutional: Negative for fever and chills.  Musculoskeletal: Positive for arthralgias. Negative for myalgias.  Skin: Negative for rash.  Hematological: Negative for adenopathy.       Objective:   Physical Exam  Constitutional: He appears well-developed and well-nourished.  Cardiovascular: Normal rate and regular rhythm.   Pulmonary/Chest: Effort normal and breath sounds normal. No respiratory distress. He has no wheezes. He has no rales.  Musculoskeletal: He exhibits no edema.  He has some diffuse tenderness involving multiple joints of hands. No obvious erythema or warmth. No joint effusions          Assessment & Plan:  Gout involving  multiple joints. He had frequent flareups during the past year. Obtain baseline uric acid level. Prednisone for the next 5 days for this acute flareup. After acute flare has resolved we'll start colchicine 0.6 mg once daily and start slow buildup of allopurinol to a goal of 300 mg daily. Bring back in 2 months and repeat uric acid then with goal less than 6

## 2012-11-03 NOTE — Patient Instructions (Addendum)
Gout Gout is an inflammatory condition (arthritis) caused by a buildup of uric acid crystals in the joints. Uric acid is a chemical that is normally present in the blood. Under some circumstances, uric acid can form into crystals in your joints. This causes joint redness, soreness, and swelling (inflammation). Repeat attacks are common. Over time, uric acid crystals can form into masses (tophi) near a joint, causing disfigurement. Gout is treatable and often preventable. CAUSES  The disease begins with elevated levels of uric acid in the blood. Uric acid is produced by your body when it breaks down a naturally found substance called purines. This also happens when you eat certain foods such as meats and fish. Causes of an elevated uric acid level include:  Being passed down from parent to child (heredity).  Diseases that cause increased uric acid production (obesity, psoriasis, some cancers).  Excessive alcohol use.  Diet, especially diets rich in meat and seafood.  Medicines, including certain cancer-fighting drugs (chemotherapy), diuretics, and aspirin.  Chronic kidney disease. The kidneys are no longer able to remove uric acid well.  Problems with metabolism. Conditions strongly associated with gout include:  Obesity.  High blood pressure.  High cholesterol.  Diabetes. Not everyone with elevated uric acid levels gets gout. It is not understood why some people get gout and others do not. Surgery, joint injury, and eating too much of certain foods are some of the factors that can lead to gout. SYMPTOMS   An attack of gout comes on quickly. It causes intense pain with redness, swelling, and warmth in a joint.  Fever can occur.  Often, only one joint is involved. Certain joints are more commonly involved:  Base of the big toe.  Knee.  Ankle.  Wrist.  Finger. Without treatment, an attack usually goes away in a few days to weeks. Between attacks, you usually will not have  symptoms, which is different from many other forms of arthritis. DIAGNOSIS  Your caregiver will suspect gout based on your symptoms and exam. Removal of fluid from the joint (arthrocentesis) is done to check for uric acid crystals. Your caregiver will give you a medicine that numbs the area (local anesthetic) and use a needle to remove joint fluid for exam. Gout is confirmed when uric acid crystals are seen in joint fluid, using a special microscope. Sometimes, blood, urine, and X-ray tests are also used. TREATMENT  There are 2 phases to gout treatment: treating the sudden onset (acute) attack and preventing attacks (prophylaxis). Treatment of an Acute Attack  Medicines are used. These include anti-inflammatory medicines or steroid medicines.  An injection of steroid medicine into the affected joint is sometimes necessary.  The painful joint is rested. Movement can worsen the arthritis.  You may use warm or cold treatments on painful joints, depending which works best for you.  Discuss the use of coffee, vitamin C, or cherries with your caregiver. These may be helpful treatment options. Treatment to Prevent Attacks After the acute attack subsides, your caregiver may advise prophylactic medicine. These medicines either help your kidneys eliminate uric acid from your body or decrease your uric acid production. You may need to stay on these medicines for a very long time. The early phase of treatment with prophylactic medicine can be associated with an increase in acute gout attacks. For this reason, during the first few months of treatment, your caregiver may also advise you to take medicines usually used for acute gout treatment. Be sure you understand your caregiver's directions.   You should also discuss dietary treatment with your caregiver. Certain foods such as meats and fish can increase uric acid levels. Other foods such as dairy can decrease levels. Your caregiver can give you a list of foods  to avoid. HOME CARE INSTRUCTIONS   Do not take aspirin to relieve pain. This raises uric acid levels.  Only take over-the-counter or prescription medicines for pain, discomfort, or fever as directed by your caregiver.  Rest the joint as much as possible. When in bed, keep sheets and blankets off painful areas.  Keep the affected joint raised (elevated).  Use crutches if the painful joint is in your leg.  Drink enough water and fluids to keep your urine clear or pale yellow. This helps your body get rid of uric acid. Do not drink alcoholic beverages. They slow the passage of uric acid.  Follow your caregiver's dietary instructions. Pay careful attention to the amount of protein you eat. Your daily diet should emphasize fruits, vegetables, whole grains, and fat-free or low-fat milk products.  Maintain a healthy body weight. SEEK MEDICAL CARE IF:   You have an oral temperature above 102 F (38.9 C).  You develop diarrhea, vomiting, or any side effects from medicines.  You do not feel better in 24 hours, or you are getting worse. SEEK IMMEDIATE MEDICAL CARE IF:   Your joint becomes suddenly more tender and you have:  Chills.  An oral temperature above 102 F (38.9 C), not controlled by medicine. MAKE SURE YOU:   Understand these instructions.  Will watch your condition.  Will get help right away if you are not doing well or get worse. Document Released: 02/01/2000 Document Revised: 04/28/2011 Document Reviewed: 05/14/2009 [facility] Patient Information 2014 [place], [place].  Allopurinol 100 mg one daily for 2 weeks, then two daily for 2 weeks, then three daily until follow up Start the colchicine when you start the Allopurinol.

## 2012-11-26 ENCOUNTER — Encounter: Payer: Self-pay | Admitting: Family Medicine

## 2012-11-26 ENCOUNTER — Ambulatory Visit (INDEPENDENT_AMBULATORY_CARE_PROVIDER_SITE_OTHER): Payer: 59 | Admitting: Family Medicine

## 2012-11-26 VITALS — BP 120/78 | HR 65 | Temp 98.0°F | Wt 316.0 lb

## 2012-11-26 DIAGNOSIS — I10 Essential (primary) hypertension: Secondary | ICD-10-CM

## 2012-11-26 DIAGNOSIS — M109 Gout, unspecified: Secondary | ICD-10-CM

## 2012-11-26 DIAGNOSIS — Z23 Encounter for immunization: Secondary | ICD-10-CM

## 2012-11-26 MED ORDER — ALLOPURINOL 300 MG PO TABS: 300.0000 mg | ORAL_TABLET | Freq: Every day | ORAL | Status: DC

## 2012-11-26 NOTE — Progress Notes (Signed)
  Subjective:    Patient ID: Eric Mann, male    DOB: [birth year], 52 y.o.   MRN: [id number]  HPI Patient here to followup regarding gout and hypertension Started back allopurinol last visit he has seen great improvement in terms of reduction in arthralgias. We did take recent brief course of prednisone and also remains on colchicine 0.6 mg once daily. He also titrated allopurinol to 300 mg daily without any adverse side effects. Recent uric acid prior to starting allopurinol  8.1.  Hypertension treated with amlodipine and stable. No recent headaches or dizziness. No chest pains.  Past Medical History  Diagnosis Date  . Gout   . Hypertension   . Gout    No past surgical history on file.  reports that he has never smoked. He has never used smokeless tobacco. He reports that he does not drink alcohol or use illicit drugs. family history includes Cancer in his sister; Diabetes in his brother, mother, and sister; Heart disease in his mother; Hypertension in his mother. There is no history of Colon cancer, Esophageal cancer, Rectal cancer, or Stomach cancer. No Known Allergies    Review of Systems  Constitutional: Negative for fatigue and unexpected weight change.  Eyes: Negative for visual disturbance.  Respiratory: Negative for cough, chest tightness and shortness of breath.   Cardiovascular: Negative for chest pain, palpitations and leg swelling.  Endocrine: Negative for polydipsia and polyuria.  Genitourinary: Negative for dysuria.  Neurological: Negative for dizziness, syncope, weakness, light-headedness and headaches.       Objective:   Physical Exam  Constitutional: He appears well-developed and well-nourished.  Cardiovascular: Normal rate and regular rhythm.   Pulmonary/Chest: Effort normal and breath sounds normal. No respiratory distress. He has no wheezes.  Musculoskeletal: He exhibits no edema.          Assessment & Plan:  #1 gout symptomatically improved.  Continue allopurinol 300 mg once daily. Check uric acid with goal less than 6. Discussed possible stopping colchicine if his uric acid at goal #2 hypertension well controlled. Continue amlodipine

## 2012-11-26 NOTE — Patient Instructions (Signed)
Gout  Gout is an inflammatory condition (arthritis) caused by a buildup of uric acid crystals in the joints. Uric acid is a chemical that is normally present in the blood. Under some circumstances, uric acid can form into crystals in your joints. This causes joint redness, soreness, and swelling (inflammation). Repeat attacks are common. Over time, uric acid crystals can form into masses (tophi) near a joint, causing disfigurement. Gout is treatable and often preventable.  CAUSES   The disease begins with elevated levels of uric acid in the blood. Uric acid is produced by your body when it breaks down a naturally found substance called purines. This also happens when you eat certain foods such as meats and fish. Causes of an elevated uric acid level include:   Being passed down from parent to child (heredity).   Diseases that cause increased uric acid production (obesity, psoriasis, some cancers).   Excessive alcohol use.   Diet, especially diets rich in meat and seafood.   Medicines, including certain cancer-fighting drugs (chemotherapy), diuretics, and aspirin.   Chronic kidney disease. The kidneys are no longer able to remove uric acid well.   Problems with metabolism.  Conditions strongly associated with gout include:   Obesity.   High blood pressure.   High cholesterol.   Diabetes.  Not everyone with elevated uric acid levels gets gout. It is not understood why some people get gout and others do not. Surgery, joint injury, and eating too much of certain foods are some of the factors that can lead to gout.  SYMPTOMS    An attack of gout comes on quickly. It causes intense pain with redness, swelling, and warmth in a joint.   Fever can occur.   Often, only one joint is involved. Certain joints are more commonly involved:   Base of the big toe.   Knee.   Ankle.   Wrist.   Finger.  Without treatment, an attack usually goes away in a few days to weeks. Between attacks, you usually will not have  symptoms, which is different from many other forms of arthritis.  DIAGNOSIS   Your caregiver will suspect gout based on your symptoms and exam. Removal of fluid from the joint (arthrocentesis) is done to check for uric acid crystals. Your caregiver will give you a medicine that numbs the area (local anesthetic) and use a needle to remove joint fluid for exam. Gout is confirmed when uric acid crystals are seen in joint fluid, using a special microscope. Sometimes, blood, urine, and X-ray tests are also used.  TREATMENT   There are 2 phases to gout treatment: treating the sudden onset (acute) attack and preventing attacks (prophylaxis).  Treatment of an Acute Attack   Medicines are used. These include anti-inflammatory medicines or steroid medicines.   An injection of steroid medicine into the affected joint is sometimes necessary.   The painful joint is rested. Movement can worsen the arthritis.   You may use warm or cold treatments on painful joints, depending which works best for you.   Discuss the use of coffee, vitamin C, or cherries with your caregiver. These may be helpful treatment options.  Treatment to Prevent Attacks  After the acute attack subsides, your caregiver may advise prophylactic medicine. These medicines either help your kidneys eliminate uric acid from your body or decrease your uric acid production. You may need to stay on these medicines for a very long time.  The early phase of treatment with prophylactic medicine can be associated   with an increase in acute gout attacks. For this reason, during the first few months of treatment, your caregiver may also advise you to take medicines usually used for acute gout treatment. Be sure you understand your caregiver's directions.  You should also discuss dietary treatment with your caregiver. Certain foods such as meats and fish can increase uric acid levels. Other foods such as dairy can decrease levels. Your caregiver can give you a list of foods  to avoid.  HOME CARE INSTRUCTIONS    Do not take aspirin to relieve pain. This raises uric acid levels.   Only take over-the-counter or prescription medicines for pain, discomfort, or fever as directed by your caregiver.   Rest the joint as much as possible. When in bed, keep sheets and blankets off painful areas.   Keep the affected joint raised (elevated).   Use crutches if the painful joint is in your leg.   Drink enough water and fluids to keep your urine clear or pale yellow. This helps your body get rid of uric acid. Do not drink alcoholic beverages. They slow the passage of uric acid.   Follow your caregiver's dietary instructions. Pay careful attention to the amount of protein you eat. Your daily diet should emphasize fruits, vegetables, whole grains, and fat-free or low-fat milk products.   Maintain a healthy body weight.  SEEK MEDICAL CARE IF:    You have an oral temperature above 102 F (38.9 C).   You develop diarrhea, vomiting, or any side effects from medicines.   You do not feel better in 24 hours, or you are getting worse.  SEEK IMMEDIATE MEDICAL CARE IF:    Your joint becomes suddenly more tender and you have:   Chills.   An oral temperature above 102 F (38.9 C), not controlled by medicine.  MAKE SURE YOU:    Understand these instructions.   Will watch your condition.   Will get help right away if you are not doing well or get worse.  Document Released: 02/01/2000 Document Revised: 04/28/2011 Document Reviewed: 05/14/2009  [employer] Patient Information 2014 [employer]

## 2013-01-03 ENCOUNTER — Ambulatory Visit (INDEPENDENT_AMBULATORY_CARE_PROVIDER_SITE_OTHER): Payer: 59 | Admitting: Family Medicine

## 2013-01-03 ENCOUNTER — Encounter: Payer: Self-pay | Admitting: Family Medicine

## 2013-01-03 VITALS — BP 120/80 | HR 55 | Temp 98.0°F | Wt 317.0 lb

## 2013-01-03 DIAGNOSIS — I10 Essential (primary) hypertension: Secondary | ICD-10-CM

## 2013-01-03 DIAGNOSIS — M109 Gout, unspecified: Secondary | ICD-10-CM

## 2013-01-03 DIAGNOSIS — Z833 Family history of diabetes mellitus: Secondary | ICD-10-CM

## 2013-01-03 NOTE — Progress Notes (Signed)
Pre visit review using our clinic review tool, if applicable. No additional management support is needed unless otherwise documented below in the visit note. 

## 2013-01-03 NOTE — Addendum Note (Signed)
Addended by: Shelby Dubin E on: 01/03/2013 10:15 AM   Modules accepted: Orders

## 2013-01-03 NOTE — Progress Notes (Signed)
  Subjective:    Patient ID: Eric Mann, male    DOB: [birth year], 52 y.o.   MRN: [id number]  HPI Patient seen for medical follow up We recently started allopurinol for his gout. He has done extremely well. Fewer arthralgias and no gout flareups. He has discontinued colchicine at this time. No side effects of allopurinol. Blood pressure remains well-controlled amlodipine. No dizziness. No headaches.  Very strong family history of type 2 diabetes in his mother and a brother and sister. Patient has not had recent fasting blood sugar  Past Medical History  Diagnosis Date  . Gout   . Hypertension   . Gout    No past surgical history on file.  reports that he has never smoked. He has never used smokeless tobacco. He reports that he does not drink alcohol or use illicit drugs. family history includes Cancer in his sister; Diabetes in his brother, mother, and sister; Heart disease in his mother; Hypertension in his mother. There is no history of Colon cancer, Esophageal cancer, Rectal cancer, or Stomach cancer. No Known Allergies    Review of Systems  Constitutional: Negative for fatigue.  Eyes: Negative for visual disturbance.  Respiratory: Negative for cough, chest tightness and shortness of breath.   Cardiovascular: Negative for chest pain, palpitations and leg swelling.  Neurological: Negative for dizziness, syncope, weakness, light-headedness and headaches.       Objective:   Physical Exam  Constitutional: He is oriented to person, place, and time. He appears well-developed and well-nourished.  Neck: Neck supple. No thyromegaly present.  Cardiovascular: Normal rate and regular rhythm.   Pulmonary/Chest: Effort normal and breath sounds normal. No respiratory distress. He has no wheezes. He has no rales.  Musculoskeletal: He exhibits no edema.  Neurological: He is alert and oriented to person, place, and time.          Assessment & Plan:  Hypertension. Well controlled. He  is encouraged to work on weight loss Gout which has been stable and improved on allopurinol. Recent uric acid less than 6 Positive family history type 2 diabetes. Fasting blood sugar here today 99. Continue yearly screening

## 2013-05-04 ENCOUNTER — Other Ambulatory Visit: Payer: Self-pay | Admitting: Family Medicine

## 2013-07-05 ENCOUNTER — Ambulatory Visit: Payer: 59 | Admitting: Family Medicine

## 2013-07-05 DIAGNOSIS — Z0289 Encounter for other administrative examinations: Secondary | ICD-10-CM

## 2014-01-03 ENCOUNTER — Other Ambulatory Visit: Payer: Self-pay | Admitting: Family Medicine

## 2014-01-11 ENCOUNTER — Encounter: Payer: 59 | Admitting: Family Medicine

## 2014-01-31 ENCOUNTER — Ambulatory Visit (INDEPENDENT_AMBULATORY_CARE_PROVIDER_SITE_OTHER): Payer: 59 | Admitting: Family Medicine

## 2014-01-31 ENCOUNTER — Encounter: Payer: Self-pay | Admitting: Family Medicine

## 2014-01-31 ENCOUNTER — Ambulatory Visit (INDEPENDENT_AMBULATORY_CARE_PROVIDER_SITE_OTHER): Payer: 59

## 2014-01-31 VITALS — BP 126/78 | HR 61 | Temp 98.0°F | Ht 75.0 in | Wt 311.0 lb

## 2014-01-31 DIAGNOSIS — Z23 Encounter for immunization: Secondary | ICD-10-CM

## 2014-01-31 DIAGNOSIS — E669 Obesity, unspecified: Secondary | ICD-10-CM | POA: Insufficient documentation

## 2014-01-31 DIAGNOSIS — Z0000 Encounter for general adult medical examination without abnormal findings: Secondary | ICD-10-CM

## 2014-01-31 LAB — BASIC METABOLIC PANEL
BUN: 13 mg/dL (ref 6–23)
CALCIUM: 8.7 mg/dL (ref 8.4–10.5)
CO2: 26 mEq/L (ref 19–32)
Chloride: 105 mEq/L (ref 96–112)
Creatinine, Ser: 1 mg/dL (ref 0.4–1.5)
GFR: 95.91 mL/min (ref >60.00)
GLUCOSE: 98 mg/dL (ref 70–99)
POTASSIUM: 3.9 meq/L (ref 3.5–5.1)
SODIUM: 138 meq/L (ref 135–145)

## 2014-01-31 LAB — CBC WITH DIFFERENTIAL/PLATELET
BASOS ABS: 0 10*3/uL (ref 0.0–0.1)
Basophils Relative: 0.4 % (ref 0.0–3.0)
Eosinophils Absolute: 0.1 10*3/uL (ref 0.0–0.7)
Eosinophils Relative: 2.7 % (ref 0.0–5.0)
HEMATOCRIT: 42.9 % (ref 39.0–52.0)
HEMOGLOBIN: 14.1 g/dL (ref 13.0–17.0)
LYMPHS ABS: 2.1 10*3/uL (ref 0.7–4.0)
Lymphocytes Relative: 40.7 % (ref 12.0–46.0)
MCHC: 32.8 g/dL (ref 30.0–36.0)
MCV: 85.9 fl (ref 78.0–100.0)
MONO ABS: 0.6 10*3/uL (ref 0.1–1.0)
MONOS PCT: 11.2 % (ref 3.0–12.0)
NEUTROS ABS: 2.3 10*3/uL (ref 1.4–7.7)
Neutrophils Relative %: 45 % (ref 43.0–77.0)
PLATELETS: 183 10*3/uL (ref 150.0–400.0)
RBC: 5 Mil/uL (ref 4.22–5.81)
RDW: 14.7 % (ref 11.5–15.5)
WBC: 5.2 10*3/uL (ref 4.0–10.5)

## 2014-01-31 LAB — TSH: TSH: 1.57 u[IU]/mL (ref 0.35–4.50)

## 2014-01-31 LAB — LIPID PANEL
CHOLESTEROL: 228 mg/dL — AB (ref 0–200)
HDL: 46 mg/dL (ref >39.00)
LDL Cholesterol: 169 mg/dL — ABNORMAL HIGH (ref 0–99)
NonHDL: 182
TRIGLYCERIDES: 63 mg/dL (ref 0.0–149.0)
Total CHOL/HDL Ratio: 5
VLDL: 12.6 mg/dL (ref 0.0–40.0)

## 2014-01-31 LAB — HEPATIC FUNCTION PANEL
ALBUMIN: 3.9 g/dL (ref 3.5–5.2)
ALK PHOS: 79 U/L (ref 39–117)
ALT: 26 U/L (ref 0–53)
AST: 24 U/L (ref 0–37)
Bilirubin, Direct: 0.1 mg/dL (ref 0.0–0.3)
TOTAL PROTEIN: 6.9 g/dL (ref 6.0–8.3)
Total Bilirubin: 0.8 mg/dL (ref 0.2–1.2)

## 2014-01-31 LAB — PSA: PSA: 1.04 ng/mL (ref 0.10–4.00)

## 2014-01-31 MED ORDER — ALLOPURINOL 300 MG PO TABS: 300.0000 mg | ORAL_TABLET | Freq: Every day | ORAL | Status: DC

## 2014-01-31 NOTE — Patient Instructions (Signed)
Work on losing some weight. Try to establish more regular exercise.

## 2014-01-31 NOTE — Progress Notes (Signed)
Pre visit review using our clinic review tool, if applicable. No additional management support is needed unless otherwise documented below in the visit note. 

## 2014-01-31 NOTE — Progress Notes (Signed)
   Subjective:    Patient ID: Eric Mann, male    DOB: [birth year], 53 y.o.   MRN: [id number]  HPI   Patient seen for complete physical. He has history of gout and hypertension. Gout has been controlled with allopurinol. Blood pressure well controlled. He plans to start exercise program seen through local gym. Nonsmoker. Stays very active with work but no formal exercise. No recent chest pains or other complaints. Tetanus up-to-date. Had colonoscopy last year.  Past Medical History  Diagnosis Date  . Gout   . Hypertension   . Gout    No past surgical history on file.  reports that he has never smoked. He has never used smokeless tobacco. He reports that he does not drink alcohol or use illicit drugs. family history includes Cancer in his sister; Diabetes in his brother, mother, and sister; Heart disease in his mother; Hypertension in his mother. There is no history of Colon cancer, Esophageal cancer, Rectal cancer, or Stomach cancer. No Known Allergies    Review of Systems  Constitutional: Negative for fever, activity change, appetite change and fatigue.  HENT: Negative for congestion, ear pain and trouble swallowing.   Eyes: Negative for pain and visual disturbance.  Respiratory: Negative for cough, shortness of breath and wheezing.   Cardiovascular: Negative for chest pain and palpitations.  Gastrointestinal: Negative for nausea, vomiting, abdominal pain, diarrhea, constipation, blood in stool, abdominal distention and rectal pain.  Genitourinary: Negative for dysuria, hematuria and testicular pain.  Musculoskeletal: Negative for joint swelling and arthralgias.  Skin: Negative for rash.  Neurological: Negative for dizziness, syncope and headaches.  Hematological: Negative for adenopathy.  Psychiatric/Behavioral: Negative for confusion and dysphoric mood.       Objective:   Physical Exam  Constitutional: He is oriented to person, place, and time. He appears well-developed and  well-nourished. No distress.  HENT:  Head: Normocephalic and atraumatic.  Right Ear: External ear normal.  Left Ear: External ear normal.  Mouth/Throat: Oropharynx is clear and moist.  Eyes: Conjunctivae and EOM are normal. Pupils are equal, round, and reactive to light.  Neck: Normal range of motion. Neck supple. No thyromegaly present.  Cardiovascular: Normal rate, regular rhythm and normal heart sounds.   No murmur heard. Pulmonary/Chest: No respiratory distress. He has no wheezes. He has no rales.  Abdominal: Soft. Bowel sounds are normal. He exhibits no distension and no mass. There is no tenderness. There is no rebound and no guarding.  Musculoskeletal: He exhibits no edema.  Lymphadenopathy:    He has no cervical adenopathy.  Neurological: He is alert and oriented to person, place, and time. He displays normal reflexes. No cranial nerve deficit.  Skin: No rash noted.  Psychiatric: He has a normal mood and affect.          Assessment & Plan:  Complete physical. Obtain screening labs including PSA. We strongly advocated he start more consistent exercise program and scale back calories and lose some weight. He is at risk for type 2 diabetes based on his size and positive family history

## 2014-02-07 ENCOUNTER — Telehealth: Payer: Self-pay | Admitting: Family Medicine

## 2014-02-07 MED ORDER — COLCHICINE 0.6 MG PO TABS: ORAL_TABLET | ORAL | Status: DC

## 2014-02-07 NOTE — Telephone Encounter (Signed)
colcrys 0.6 mg two at onset and then one po bid prn #60 with one refill.  He may supplement with Aleve or Advil.

## 2014-02-07 NOTE — Telephone Encounter (Signed)
Pt informed. Rx sent to pharmacy  

## 2014-02-07 NOTE — Telephone Encounter (Signed)
Pt states he is having a gout flair up in his right hand. Would like the faster acting colcrys for his gout and something for pain. [facility]

## 2014-04-19 ENCOUNTER — Encounter: Payer: Self-pay | Admitting: Family Medicine

## 2014-04-19 ENCOUNTER — Ambulatory Visit (INDEPENDENT_AMBULATORY_CARE_PROVIDER_SITE_OTHER): Payer: 59 | Admitting: Family Medicine

## 2014-04-19 VITALS — BP 130/80 | HR 58 | Temp 97.9°F | Wt 317.0 lb

## 2014-04-19 DIAGNOSIS — M79672 Pain in left foot: Secondary | ICD-10-CM

## 2014-04-19 DIAGNOSIS — R6882 Decreased libido: Secondary | ICD-10-CM

## 2014-04-19 LAB — TESTOSTERONE: Testosterone: 418.81 ng/dL (ref 300.00–890.00)

## 2014-04-19 NOTE — Patient Instructions (Signed)
Consider metatarsal pad or orthotic insert for additional cushioning If not better with that consider podiatry referral.

## 2014-04-19 NOTE — Progress Notes (Signed)
   Subjective:    Patient ID: Eric Mann, male    DOB: [birth year], 54 y.o.   MRN: [id number]  HPI Patient seen for the following issues  Left foot pain. He states he had a pedicure about 2 months ago. Had some pain since then. He does not have a toenail issues but has pain in the region of the second metatarsal head. He does not have any hammertoe or history of callus in that region. No erythema. No ecchymosis. Pain is worse after prolonged. His standing. He has not noted any metatarsal tenderness. No alleviating factors. He has not tried any metatarsal pads her inserts. History of questionable gout but this does not do saying. He has not had any signs of inflammation such as erythema or warmth.  Low libido. Noted for several months. He does work very long hours and possibly not getting enough sleep. Denies any depression symptoms. He takes amlodipine for hypertension. No erectile difficulties. He does not take any antidepressants.  Past Medical History  Diagnosis Date  . Gout   . Hypertension   . Gout    No past surgical history on file.  reports that he has never smoked. He has never used smokeless tobacco. He reports that he does not drink alcohol or use illicit drugs. family history includes Cancer in his sister; Diabetes in his brother, mother, and sister; Heart disease in his mother; Hypertension in his mother. There is no history of Colon cancer, Esophageal cancer, Rectal cancer, or Stomach cancer. No Known Allergies    Review of Systems  Constitutional: Positive for fatigue.  Eyes: Negative for visual disturbance.  Respiratory: Negative for cough, chest tightness and shortness of breath.   Cardiovascular: Negative for chest pain, palpitations and leg swelling.  Neurological: Negative for dizziness, syncope, weakness, light-headedness and headaches.  Hematological: Negative for adenopathy.       Objective:   Physical Exam  Constitutional: He appears well-developed and  well-nourished.  Cardiovascular: Normal rate and regular rhythm.  Exam reveals no gallop.   Pulmonary/Chest: Effort normal and breath sounds normal. No respiratory distress. He has no wheezes. He has no rales.  Musculoskeletal: He exhibits no edema.  Left foot reveals a callus over the lateral distal aspect of the foot but none in the area of discomfort. He has some minimal tenderness of the second metatarsal head but no callus that region. No warmth. No erythema. No ecchymosis. Normal distal foot pulses          Assessment & Plan:  #1 left foot pain. No evidence for significant callus. Question metatarsalgia. Recommend trial metatarsal pad. If not getting relief with that consider podiatry referral. He does have history of gout but this is not appear compatible with gout #2 low libido. Check total testosterone level. Consider increased sleep.  Suspect at least some of this may be fatigue related

## 2014-04-19 NOTE — Progress Notes (Signed)
Pre visit review using our clinic review tool, if applicable. No additional management support is needed unless otherwise documented below in the visit note. 

## 2014-04-20 ENCOUNTER — Ambulatory Visit: Payer: 59 | Admitting: Family Medicine

## 2014-05-07 ENCOUNTER — Other Ambulatory Visit: Payer: Self-pay | Admitting: Family Medicine

## 2014-05-19 ENCOUNTER — Encounter: Payer: Self-pay | Admitting: Podiatry

## 2014-05-19 ENCOUNTER — Ambulatory Visit (INDEPENDENT_AMBULATORY_CARE_PROVIDER_SITE_OTHER): Payer: 59 | Admitting: Podiatry

## 2014-05-19 VITALS — BP 161/92 | HR 71 | Ht 75.5 in | Wt 317.0 lb

## 2014-05-19 DIAGNOSIS — M216X2 Other acquired deformities of left foot: Secondary | ICD-10-CM | POA: Diagnosis not present

## 2014-05-19 DIAGNOSIS — M7742 Metatarsalgia, left foot: Secondary | ICD-10-CM | POA: Diagnosis not present

## 2014-05-19 DIAGNOSIS — M216X1 Other acquired deformities of right foot: Secondary | ICD-10-CM

## 2014-05-19 DIAGNOSIS — M216X9 Other acquired deformities of unspecified foot: Secondary | ICD-10-CM | POA: Insufficient documentation

## 2014-05-19 NOTE — Patient Instructions (Signed)
Seen for left foot pain. Noted of tight Achilles tendon and fungus on both feet. Need to do stretch exercise and need custom orthotics. Also use Salsun blue shampoo to scrub feet after each shower.

## 2014-05-19 NOTE — Progress Notes (Signed)
Subjective: 54 year old male presents with pain in left foot.  Stated that he walks in concrete a lot at least 8 hours or more. Started to hurt under ball and base of toes on top of left foot two weeks ago. Pain pills did not walk. "Kills me when I walk".  Review of Systems: Negative except history of Gout. Usually hits in hand. Positive of hypertension.   Objective: Neurovascular status are within normal. Dermatologic: Severe peeling and scaling skin both feet plantar. Orthopedic: Tight Achilles tendon bilateral. Pain at lesser MPJ left with weight bearing. No visible edema or erythema noted.  Plan: Reviewed findings and stretch exercise. Home care instruction for tinea pedis reviewed. Return for custom orthotics.

## 2014-05-23 ENCOUNTER — Ambulatory Visit (INDEPENDENT_AMBULATORY_CARE_PROVIDER_SITE_OTHER): Payer: 59 | Admitting: Podiatry

## 2014-05-23 ENCOUNTER — Encounter: Payer: Self-pay | Admitting: Podiatry

## 2014-05-23 DIAGNOSIS — M21969 Unspecified acquired deformity of unspecified lower leg: Secondary | ICD-10-CM

## 2014-05-23 DIAGNOSIS — M216X9 Other acquired deformities of unspecified foot: Secondary | ICD-10-CM

## 2014-05-23 DIAGNOSIS — M216X1 Other acquired deformities of right foot: Secondary | ICD-10-CM

## 2014-05-23 DIAGNOSIS — M216X2 Other acquired deformities of left foot: Secondary | ICD-10-CM

## 2014-05-23 NOTE — Progress Notes (Signed)
Subjective: 54 year old male presents with pain in left foot.  Stated that he is doing stretch exercise and Salsunblue scrub.  Objective: Neurovascular status are within normal. Dermatologic: Severe peeling and scaling skin both feet plantar. Orthopedic: Tight Achilles tendon bilateral. Pain at lesser MPJ left with weight bearing. No visible edema or erythema noted. Radiographic examination reveal short first ray, metatarsus adductus, large plantar calcaneal spur, with elevated first ray bilateral.  Plan: Both feet casted for orthotics.

## 2014-05-23 NOTE — Patient Instructions (Signed)
Both feet casted for orthotics. Continue with stretch exercise.

## 2014-07-31 ENCOUNTER — Ambulatory Visit: Payer: 59 | Admitting: Podiatry

## 2014-11-27 ENCOUNTER — Ambulatory Visit (INDEPENDENT_AMBULATORY_CARE_PROVIDER_SITE_OTHER): Payer: 59 | Admitting: Family Medicine

## 2014-11-27 ENCOUNTER — Ambulatory Visit (INDEPENDENT_AMBULATORY_CARE_PROVIDER_SITE_OTHER): Payer: 59 | Admitting: *Deleted

## 2014-11-27 VITALS — BP 120/80 | HR 58 | Temp 98.5°F | Wt 316.5 lb

## 2014-11-27 DIAGNOSIS — M25512 Pain in left shoulder: Secondary | ICD-10-CM | POA: Diagnosis not present

## 2014-11-27 DIAGNOSIS — M7582 Other shoulder lesions, left shoulder: Secondary | ICD-10-CM

## 2014-11-27 DIAGNOSIS — Z23 Encounter for immunization: Secondary | ICD-10-CM | POA: Diagnosis not present

## 2014-11-27 MED ORDER — METHYLPREDNISOLONE ACETATE 80 MG/ML IJ SUSP: 80.0000 mg | Freq: Once | INTRAMUSCULAR | Status: DC

## 2014-11-27 MED ORDER — METHYLPREDNISOLONE ACETATE 80 MG/ML IJ SUSP
80.0000 mg | Freq: Once | INTRAMUSCULAR | Status: AC
  Administered 2014-11-27: 80 mg via INTRA_ARTICULAR

## 2014-11-27 NOTE — Progress Notes (Signed)
Pre visit review using our clinic review tool, if applicable. No additional management support is needed unless otherwise documented below in the visit note. 

## 2014-11-27 NOTE — Patient Instructions (Signed)
Rotator Cuff Tendinitis  Rotator cuff tendinitis is inflammation of the tough, cord-like bands that connect muscle to bone (tendons) in your rotator cuff. Your rotator cuff is the collection of all the muscles and tendons that connect your arm to your shoulder. Your rotator cuff holds the head of your upper arm bone (humerus) in the cup (fossa) of your shoulder blade (scapula).  CAUSES  Rotator cuff tendinitis is usually caused by overusing the joint involved.   SIGNS AND SYMPTOMS  · Deep ache in the shoulder also felt on the outside upper arm over the shoulder muscle.  · Point tenderness over the area that is injured.  · Pain comes on gradually and becomes worse with lifting the arm to the side (abduction) or turning it inward (internal rotation).  · May lead to a chronic tear: When a rotator cuff tendon becomes inflamed, it runs the risk of losing its blood supply, causing some tendon fibers to die. This increases the risk that the tendon can fray and partially or completely tear.  DIAGNOSIS  Rotator cuff tendinitis is diagnosed by taking a medical history, performing a physical exam, and reviewing results of imaging exams. The medical history is useful to help determine the type of rotator cuff injury. The physical exam will include looking at the injured shoulder, feeling the injured area, and watching you do range-of-motion exercises. X-ray exams are typically done to rule out other causes of shoulder pain, such as fractures. MRI is the imaging exam usually used for significant shoulder injuries. Sometimes a dye study called CT arthrogram is done, but it is not as widely used as MRI. In some institutions, special ultrasound tests may also be used to aid in the diagnosis.  TREATMENT   Less Severe Cases  · Use of a sling to rest the shoulder for a short period of time. Prolonged use of the sling can cause stiffness, weakness, and loss of motion of the shoulder joint.  · Anti-inflammatory medicines, such as  ibuprofen or naproxen sodium, may be prescribed.  More Severe Cases  · Physical therapy.  · Use of steroid injections into the shoulder joint.  · Surgery.  HOME CARE INSTRUCTIONS   · Use a sling or splint until the pain decreases. Prolonged use of the sling can cause stiffness, weakness, and loss of motion of the shoulder joint.  · Apply ice to the injured area:    Put ice in a plastic bag.    Place a towel between your skin and the bag.    Leave the ice on for 20 minutes, 2-3 times a day.  · Try to avoid use other than gentle range of motion while your shoulder is painful. Use the shoulder and exercise only as directed by your health care provider. Stop exercises or range of motion if pain or discomfort increases, unless directed otherwise by your health care provider.  · Only take over-the-counter or prescription medicines for pain, discomfort, or fever as directed by your health care provider.  · If you were given a shoulder sling and straps (immobilizer), do not remove it except as directed, or until you see a health care provider for a follow-up exam. If you need to remove it, move your arm as little as possible or as directed.  · You may want to sleep on several pillows at night to lessen swelling and pain.  SEEK IMMEDIATE MEDICAL CARE IF:   · Your shoulder pain increases or new pain develops in your arm, hand,   or fingers and is not relieved with medicines.  · You have new, unexplained symptoms, especially increased numbness in the hands or loss of strength.  · You develop any worsening of the problems that brought you in for care.  · Your arm, hand, or fingers are numb or tingling.  · Your arm, hand, or fingers are swollen, painful, or turn white or blue.  MAKE SURE YOU:  · Understand these instructions.  · Will watch your condition.  · Will get help right away if you are not doing well or get worse.     This information is not intended to replace advice given to you by your health care provider. Make sure  you discuss any questions you have with your health care provider.     Document Released: 04/26/2003 Document Revised: 02/24/2014 Document Reviewed: 09/15/2012  [employer]vier Interactive Patient Education ©2016 [employer]

## 2014-11-27 NOTE — Progress Notes (Signed)
   Subjective:    Patient ID: Eric Mann, male    DOB: [birth year], 54 y.o.   MRN: [id number]  HPI Patient seen with left shoulder pain. Onset couple weeks ago. No known injury. He has history of rotator cuff tendinitis in the right and states these symptoms are very similar. He has pain which is a deep achy pain worse with abduction and internal rotation. He is having night pain when he rolls over onto his left shoulder. Denies any neck pain. No weakness. No radiculopathy symptoms. No upper extremity numbness. He has not taken any medications for this. He does have history of gout which has been well controlled with allopurinol.  Past Medical History  Diagnosis Date  . Gout   . Hypertension   . Gout    No past surgical history on file.  reports that he has never smoked. He has never used smokeless tobacco. He reports that he does not drink alcohol or use illicit drugs. family history includes Cancer in his sister; Diabetes in his brother, mother, and sister; Heart disease in his mother; Hypertension in his mother. There is no history of Colon cancer, Esophageal cancer, Rectal cancer, or Stomach cancer. No Known Allergies    Review of Systems  Constitutional: Negative for appetite change and unexpected weight change.  Respiratory: Negative for shortness of breath.   Cardiovascular: Negative for chest pain.       Objective:   Physical Exam  Constitutional: He appears well-developed and well-nourished.  Cardiovascular: Normal rate and regular rhythm.   Pulmonary/Chest: Effort normal and breath sounds normal. No respiratory distress. He has no wheezes. He has no rales.  Musculoskeletal:  Left shoulder reveals no visible swelling. No warmth. No erythema. Pain with abduction against resistance greater than 90 and internal rotation. No acromioclavicular tenderness. No biceps tenderness.  Neurological:  Full strength upper extremities. Normal sensory function          Assessment  & Plan:  Left shoulder pain. Suspect left rotator cuff tendinitis. Discussed risks and benefits of corticosteroid injection and patient consented.  After prepping skin with betadine, injected 1 cc depomedrol and 2 cc of plain xylocaine with 23 gauge one and one half inch needle using posterior lateral approach and pt tolerated well. Instructed in range of motion. Icing for the next couple days. Follow-up if not resolving over the next couple of weeks.

## 2014-11-27 NOTE — Addendum Note (Signed)
Addended by: Ailene Rud E on: 11/27/2014 12:11 PM   Modules accepted: Orders, Medications

## 2015-03-03 ENCOUNTER — Other Ambulatory Visit: Payer: Self-pay | Admitting: Family Medicine

## 2015-04-19 ENCOUNTER — Encounter (HOSPITAL_BASED_OUTPATIENT_CLINIC_OR_DEPARTMENT_OTHER): Payer: Self-pay | Admitting: Emergency Medicine

## 2015-04-19 ENCOUNTER — Emergency Department (HOSPITAL_BASED_OUTPATIENT_CLINIC_OR_DEPARTMENT_OTHER)
Admission: EM | Admit: 2015-04-19 | Discharge: 2015-04-19 | Disposition: A | Discharge status: Home / Self Care | Payer: Worker's Compensation | Attending: Emergency Medicine | Admitting: Emergency Medicine

## 2015-04-19 DIAGNOSIS — S39012A Strain of muscle, fascia and tendon of lower back, initial encounter: Secondary | ICD-10-CM | POA: Diagnosis not present

## 2015-04-19 DIAGNOSIS — I10 Essential (primary) hypertension: Secondary | ICD-10-CM | POA: Diagnosis not present

## 2015-04-19 DIAGNOSIS — Z79899 Other long term (current) drug therapy: Secondary | ICD-10-CM | POA: Diagnosis not present

## 2015-04-19 DIAGNOSIS — M109 Gout, unspecified: Secondary | ICD-10-CM | POA: Insufficient documentation

## 2015-04-19 DIAGNOSIS — X58XXXA Exposure to other specified factors, initial encounter: Secondary | ICD-10-CM | POA: Diagnosis not present

## 2015-04-19 DIAGNOSIS — Y9289 Other specified places as the place of occurrence of the external cause: Secondary | ICD-10-CM | POA: Diagnosis not present

## 2015-04-19 DIAGNOSIS — Y9389 Activity, other specified: Secondary | ICD-10-CM | POA: Insufficient documentation

## 2015-04-19 DIAGNOSIS — Y990 Civilian activity done for income or pay: Secondary | ICD-10-CM | POA: Diagnosis not present

## 2015-04-19 DIAGNOSIS — S3992XA Unspecified injury of lower back, initial encounter: Secondary | ICD-10-CM | POA: Diagnosis present

## 2015-04-19 MED ORDER — DIAZEPAM 5 MG PO TABS: 5.0000 mg | ORAL_TABLET | Freq: Three times a day (TID) | ORAL | Status: DC | PRN

## 2015-04-19 NOTE — ED Provider Notes (Signed)
CSN: [id number]     Arrival date & time 04/19/15  1922 History   First MD Initiated Contact with Patient 04/19/15 2143     Chief Complaint  Patient presents with  . Back Pain     (Consider location/radiation/quality/duration/timing/severity/associated sxs/prior Treatment) HPI Comments: Patient presents emergency department with chief complaint of back pain. He states that he was pulling some pallets today at work, and strained his low back. He states that he has some pain that radiates to his left leg. He denies any bowel or bladder incontinence. Denies any other injuries. Denies any fevers chills. His symptoms are aggravated with movement and palpation. He denies any weakness, numbness, or tingling.  The history is provided by the patient. No language interpreter was used.    Past Medical History  Diagnosis Date  . Gout   . Hypertension   . Gout    History reviewed. No pertinent past surgical history. Family History  Problem Relation Age of Onset  . Heart disease Mother   . Hypertension Mother   . Diabetes Mother   . Diabetes Sister   . Cancer Sister     cervix cancer  . Diabetes Brother   . Colon cancer Neg Hx   . Esophageal cancer Neg Hx   . Rectal cancer Neg Hx   . Stomach cancer Neg Hx    Social History  Substance Use Topics  . Smoking status: Never Smoker   . Smokeless tobacco: Never Used  . Alcohol Use: No    Review of Systems  Constitutional: Negative for fever and chills.  Gastrointestinal:       No bowel incontinence  Genitourinary:       No urinary incontinence  Musculoskeletal: Positive for myalgias, back pain and arthralgias.  Neurological:       No saddle anesthesia  All other systems reviewed and are negative.     Allergies  Review of patient's allergies indicates no known allergies.  Home Medications   Prior to Admission medications   Medication Sig Start Date End Date Taking? Authorizing Provider  allopurinol (ZYLOPRIM) 300 MG tablet  TAKE 1 TABLET BY MOUTH DAILY 03/05/15   Eulas Post, MD  amLODipine (NORVASC) 5 MG tablet TAKE 1 TABLET BY MOUTH EVERY DAY 05/08/14   Eulas Post, MD  colchicine 0.6 MG tablet Take two tablets by mouth at onset then one tablet 2 times daily as needed. 02/07/14   Eulas Post, MD  diazepam (VALIUM) 5 MG tablet Take 1 tablet (5 mg total) by mouth every 8 (eight) hours as needed for anxiety. 04/19/15   Montine Circle, PA-C   BP 137/90 mmHg  Pulse 63  Temp(Src) 97.9 F (36.6 C) (Oral)  Resp 18  Ht 6\' 4"  (1.93 m)  Wt 142.883 kg  BMI 38.36 kg/m2  SpO2 97% Physical Exam  Constitutional: He is oriented to person, place, and time. He appears well-developed and well-nourished. No distress.  HENT:  Head: Normocephalic and atraumatic.  Eyes: Conjunctivae and EOM are normal. Right eye exhibits no discharge. Left eye exhibits no discharge. No scleral icterus.  Neck: Normal range of motion. Neck supple. No tracheal deviation present.  Cardiovascular: Normal rate, regular rhythm and normal heart sounds.  Exam reveals no gallop and no friction rub.   No murmur heard. Pulmonary/Chest: Effort normal and breath sounds normal. No respiratory distress. He has no wheezes.  Abdominal: Soft. He exhibits no distension. There is no tenderness.  Musculoskeletal: Normal range of motion.  Lumbar  paraspinal muscles tender to palpation, no bony tenderness, step-offs, or gross abnormality or deformity of spine, patient is able to ambulate, moves all extremities  Bilateral great toe extension intact Bilateral plantar/dorsiflexion intact  Neurological: He is alert and oriented to person, place, and time.  Sensation and strength intact bilaterally   Skin: Skin is warm. He is not diaphoretic.  Psychiatric: He has a normal mood and affect. His behavior is normal. Judgment and thought content normal.  Nursing note and vitals reviewed.   ED Course  Procedures (including critical care time)    MDM    Final diagnoses:  Lumbar strain, initial encounter    Patient with back pain.  No neurological deficits and normal neuro exam.  Patient is ambulatory.  No loss of bowel or bladder control.  Doubt cauda equina.  Denies fever,  doubt epidural abscess or other lesion. Recommend back exercises, stretching, RICE, and will treat with a short course of valium.  Encouraged the patient that there could be a need for additional workup and/or imaging such as MRI, if the symptoms do not resolve. Patient advised that if the back pain does not resolve, or radiates, this could progress to more serious conditions and is encouraged to follow-up with PCP or orthopedics within 2 weeks.       Montine Circle, PA-C 04/19/15 2221  Fredia Sorrow, MD 04/20/15 [phone]

## 2015-04-19 NOTE — ED Notes (Signed)
Patient states that he was pulling some pallets at work and hurt his back. Reports that pain is to his lower left side.

## 2015-04-19 NOTE — Discharge Instructions (Signed)

## 2015-04-19 NOTE — ED Notes (Signed)
PA at bedside.

## 2015-04-20 ENCOUNTER — Telehealth: Payer: Self-pay | Admitting: Family Medicine

## 2015-04-20 NOTE — Telephone Encounter (Signed)
error 

## 2015-04-23 ENCOUNTER — Ambulatory Visit (INDEPENDENT_AMBULATORY_CARE_PROVIDER_SITE_OTHER): Payer: 59 | Admitting: Family Medicine

## 2015-04-23 DIAGNOSIS — S39012A Strain of muscle, fascia and tendon of lower back, initial encounter: Secondary | ICD-10-CM

## 2015-04-23 MED ORDER — DICLOFENAC SODIUM 75 MG PO TBEC: 75.0000 mg | DELAYED_RELEASE_TABLET | Freq: Two times a day (BID) | ORAL | Status: DC

## 2015-04-23 NOTE — Patient Instructions (Signed)

## 2015-04-23 NOTE — Progress Notes (Signed)
   Subjective:    Patient ID: Eric Mann, male    DOB: 1960/12/04, 55 y.o.   MRN: [id number]  HPI Patient seen with low back pain Onset last Friday. He was at work and pulling on a pallet which suddenly gave way and he strained his left lower lumbar area. He describes a dull ache. Occasional radiation toward the left thigh. No numbness or weakness. Went to emergency department over the weekend. No x-rays. Prescribed diazepam 5 mg every 12 hours. He is using heat and ice with minimal relief. Pain is mild to moderate. He was given some stretches. He has not had any urine or stool incontinence. Ambulating without difficulties  Past Medical History  Diagnosis Date  . Gout   . Hypertension   . Gout    No past surgical history on file.  reports that he has never smoked. He has never used smokeless tobacco. He reports that he does not drink alcohol or use illicit drugs. family history includes Cancer in his sister; Diabetes in his brother, mother, and sister; Heart disease in his mother; Hypertension in his mother. There is no history of Colon cancer, Esophageal cancer, Rectal cancer, or Stomach cancer. No Known Allergies    Review of Systems  Constitutional: Negative for fever, activity change, appetite change and unexpected weight change.  Respiratory: Negative for cough and shortness of breath.   Cardiovascular: Negative for chest pain and leg swelling.  Gastrointestinal: Negative for vomiting and abdominal pain.  Genitourinary: Negative for dysuria, hematuria and flank pain.  Musculoskeletal: Positive for back pain. Negative for joint swelling.  Neurological: Negative for weakness and numbness.       Objective:   Physical Exam  Constitutional: He is oriented to person, place, and time. He appears well-developed and well-nourished. No distress.  Neck: No thyromegaly present.  Cardiovascular: Normal rate, regular rhythm and normal heart sounds.   No murmur  heard. Pulmonary/Chest: Effort normal and breath sounds normal. No respiratory distress. He has no wheezes. He has no rales.  Musculoskeletal: He exhibits no edema.  Straight leg raise are negative No spinal tenderness. Minimal left paraspinous muscle tenderness  Neurological: He is alert and oriented to person, place, and time. No cranial nerve deficit.  Trace reflexes knee and ankle bilaterally. No focal weakness.  Skin: No rash noted.          Assessment & Plan:  Acute left lumbar back strain. Continue heat or ice for symptom relief. Diclofenac 75 mg with food every 12 hours as needed. Continue stretches. Reviewed appropriate lifting. Touch base in 1-2 weeks if not improving.

## 2015-04-23 NOTE — Progress Notes (Signed)
Pre visit review using our clinic review tool, if applicable. No additional management support is needed unless otherwise documented below in the visit note. 

## 2015-04-25 ENCOUNTER — Telehealth: Payer: Self-pay | Admitting: Family Medicine

## 2015-04-25 NOTE — Telephone Encounter (Signed)
Recommended for pt to take diclofenac

## 2015-04-25 NOTE — Telephone Encounter (Signed)
Left message with patient. Per last note pt was to start diclofenac daily. Has this helped at all?

## 2015-04-25 NOTE — Telephone Encounter (Signed)
Pt was seen on 04-23-15 for back pain and now would like pain med call into [facility]

## 2015-04-26 MED ORDER — METHOCARBAMOL 500 MG PO TABS: 500.0000 mg | ORAL_TABLET | Freq: Three times a day (TID) | ORAL | Status: DC | PRN

## 2015-04-26 NOTE — Addendum Note (Signed)
Addended by: Elio Forget on: 04/26/2015 03:25 PM   Modules accepted: Orders

## 2015-04-26 NOTE — Telephone Encounter (Signed)
I would not change NSAIDS at this time.  Can add Robaxin 500 mg one po q8 hours- #30 with no refill but this could cause some sedation.  This would be mostly for night time use. He may also supplement Tylenol with the Diclofenac.

## 2015-04-26 NOTE — Telephone Encounter (Signed)
Pt has been taking Diclofenac as needed with no difference in his pain or symptoms since he was seen in the office. The level of pain is 7-8 out of 10. He is able to work and will continue to do so, but wants a different medication to at least take the edge off. Please advise.

## 2015-04-26 NOTE — Telephone Encounter (Signed)
Tried calling patient back for the second time with NA.

## 2015-04-26 NOTE — Telephone Encounter (Signed)
Pt states he is available to talk now. Please call back.

## 2015-04-26 NOTE — Telephone Encounter (Signed)
Pt is aware of annotations below via voicemail. New RX sent into the pharmacy.

## 2015-04-27 ENCOUNTER — Telehealth: Payer: Self-pay | Admitting: Family Medicine

## 2015-04-27 ENCOUNTER — Emergency Department (INDEPENDENT_AMBULATORY_CARE_PROVIDER_SITE_OTHER): Payer: 59

## 2015-04-27 ENCOUNTER — Emergency Department (INDEPENDENT_AMBULATORY_CARE_PROVIDER_SITE_OTHER)
Admission: EM | Admit: 2015-04-27 | Discharge: 2015-04-27 | Disposition: A | Discharge status: Home / Self Care | Payer: Worker's Compensation | Source: Home / Self Care | Attending: Family Medicine | Admitting: Family Medicine

## 2015-04-27 DIAGNOSIS — R030 Elevated blood-pressure reading, without diagnosis of hypertension: Secondary | ICD-10-CM

## 2015-04-27 DIAGNOSIS — IMO0001 Reserved for inherently not codable concepts without codable children: Secondary | ICD-10-CM

## 2015-04-27 DIAGNOSIS — M25552 Pain in left hip: Secondary | ICD-10-CM | POA: Diagnosis not present

## 2015-04-27 DIAGNOSIS — M545 Low back pain: Secondary | ICD-10-CM | POA: Diagnosis not present

## 2015-04-27 DIAGNOSIS — M5442 Lumbago with sciatica, left side: Secondary | ICD-10-CM

## 2015-04-27 DIAGNOSIS — M47816 Spondylosis without myelopathy or radiculopathy, lumbar region: Secondary | ICD-10-CM

## 2015-04-27 MED ORDER — PREDNISONE 20 MG PO TABS: ORAL_TABLET | ORAL | Status: DC

## 2015-04-27 MED ORDER — IBUPROFEN 800 MG PO TABS
800.0000 mg | ORAL_TABLET | Freq: Once | ORAL | Status: AC
  Administered 2015-04-27: 800 mg via ORAL

## 2015-04-27 NOTE — Telephone Encounter (Signed)
Spoke with patient and he has not yet tried the medication that was sent to the pharmacy.  Patient will try the medication and if symptoms do not improve he will schedule a follow up with Dr Elease Hashimoto next week.

## 2015-04-27 NOTE — ED Notes (Signed)
According to patient hurt back at work Thursday 3/2 2017 around 6 pm.  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  According to patient, he did not fall.  As soon as he started stumbling, he felt sharp pain in the lower back.  He walked over to a work area, and said that he could not straighten up.  Was sent to [place].  Went back to work Monday, worked last night, and started feeling pain when he was leaving work.  He went to get out of bed this morning and stated that he could not straighten up.  Stated that pain was greater than 10 at that point.  He is also beginning to have pain shooting down the left leg.

## 2015-04-27 NOTE — ED Provider Notes (Signed)
CSN: [id number]     Arrival date & time 04/27/15  1444 History   First MD Initiated Contact with Patient 04/27/15 1500     Chief Complaint  Patient presents with  . Back Pain   (Consider location/radiation/quality/duration/timing/severity/associated sxs/prior Treatment) HPI The pt is a 55yo male presenting to [facility] with c/o Left lower back pain that radiates down his Left leg that started on Thursday 04/19/15 after he tried pulling some pallets at work.  He was initially seen at [facility], discharged with valium and advised to f/u with PCP in 1 week if not improving.  Pt then saw his PCP who prescribed him antiinflammatories but states they have not been working so his PCP just called in a pain medication today but he has not been able to take it yet as he was warned it would make him tired.  His job referred him here to [facility] today for further evaluation of the continued pain.  Pain is aching and throbbing, occasionally sharp shooting with certain positions.  Pt states it is difficult to get in a comfortable position or walk due to the pain.  Pain is 10/10 at times.  Denies prior back injuries or surgeries. Denies numbness in groin or legs. Denies change in bowel or bladder habits.   Pt noted to have elevated blood pressure in triage. Hx of HTN and does take his BP medication as prescribed but states he is in a lot of pain from his back.  Past Medical History  Diagnosis Date  . Gout   . Hypertension   . Gout    History reviewed. No pertinent past surgical history. Family History  Problem Relation Age of Onset  . Heart disease Mother   . Hypertension Mother   . Diabetes Mother   . Diabetes Sister   . Cancer Sister     cervix cancer  . Diabetes Brother   . Colon cancer Neg Hx   . Esophageal cancer Neg Hx   . Rectal cancer Neg Hx   . Stomach cancer Neg Hx    Social History  Substance Use Topics  . Smoking status: Never Smoker   . Smokeless tobacco: Never Used  . Alcohol Use: No    Review  of Systems  Musculoskeletal: Positive for myalgias, back pain and gait problem. Negative for arthralgias, neck pain and neck stiffness.  Skin: Negative for color change and wound.  Neurological: Negative for weakness and numbness.    Allergies  Review of patient's allergies indicates no known allergies.  Home Medications   Prior to Admission medications   Medication Sig Start Date End Date Taking? Authorizing Provider  allopurinol (ZYLOPRIM) 300 MG tablet TAKE 1 TABLET BY MOUTH DAILY 03/05/15   Eulas Post, MD  amLODipine (NORVASC) 5 MG tablet TAKE 1 TABLET BY MOUTH EVERY DAY 05/08/14   Eulas Post, MD  colchicine 0.6 MG tablet Take two tablets by mouth at onset then one tablet 2 times daily as needed. 02/07/14   Eulas Post, MD  diazepam (VALIUM) 5 MG tablet Take 1 tablet (5 mg total) by mouth every 8 (eight) hours as needed for anxiety. 04/19/15   Montine Circle, PA-C  diclofenac (VOLTAREN) 75 MG EC tablet Take 1 tablet (75 mg total) by mouth 2 (two) times daily. 04/23/15   Eulas Post, MD  methocarbamol (ROBAXIN) 500 MG tablet Take 1 tablet (500 mg total) by mouth every 8 (eight) hours as needed for muscle spasms. 04/26/15   Alinda Sierras  Burchette, MD  predniSONE (DELTASONE) 20 MG tablet 3 tabs po day one, then 2 po daily x 4 days 04/27/15   Noland Fordyce, PA-C   Meds Ordered and Administered this Visit   Medications  ibuprofen (ADVIL,MOTRIN) tablet 800 mg (800 mg Oral Given 04/27/15 1539)    BP 153/101 mmHg  Pulse 68  Temp(Src) 97.9 F (36.6 C) (Oral)  Ht 6' 3.5" (1.918 m)  Wt 329 lb 8 oz (149.46 kg)  BMI 40.63 kg/m2  SpO2 98% No data found.   Physical Exam  Constitutional: He is oriented to person, place, and time. He appears well-developed and well-nourished.  Obese male sitting on exam table, appears well. NAD.   HENT:  Head: Normocephalic and atraumatic.  Eyes: EOM are normal.  Neck: Normal range of motion. Neck supple.  No midline bone tenderness, no  crepitus or step-offs.   Cardiovascular: Normal rate.   Pulmonary/Chest: Effort normal.  Musculoskeletal: Normal range of motion. He exhibits tenderness. He exhibits no edema.  Mild midline lower lumbar tenderness with Left sided lumbar muscular tenderness. Increased pain with lying flat and with straight leg raise. 5/5 strength with plantarflexion and dorsiflexion. 5/5 strength with flexion and extension at the knee. Increased pain with rotation movements at the wait and leaning forward.  Neurological: He is alert and oriented to person, place, and time.  Reflex Scores:      Patellar reflexes are 2+ on the right side and 2+ on the left side. Antalgic gait  Skin: Skin is warm and dry. No rash noted. No erythema.  Psychiatric: He has a normal mood and affect. His behavior is normal.  Nursing note and vitals reviewed.   ED Course  Procedures (including critical care time)  Labs Review Labs Reviewed - No data to display  Imaging Review Dg Lumbar Spine Complete  04/27/2015  CLINICAL DATA:  Low back pain.  Left hip pain EXAM: LUMBAR SPINE - COMPLETE 4+ VIEW COMPARISON:  None. FINDINGS: Normal lumbar alignment. Negative for fracture or pars defect. Mild disc and facet degeneration L4-5 and L5-S1. No bony lesion. SI joints intact. IMPRESSION: Mild degenerative change L4-5 and L5-S1. No superimposed acute abnormality. Electronically Signed   By: Franchot Gallo M.D.   On: 04/27/2015 16:11   Dg Hip Unilat With Pelvis 2-3 Views Left  04/27/2015  CLINICAL DATA:  Pulling a heavy object 8 days ago with persistent hip pain. Initial encounter. EXAM: DG HIP (WITH OR WITHOUT PELVIS) 2-3V LEFT COMPARISON:  None. FINDINGS: Frontal pelvis shows no fracture. SI joints and symphysis pubis are unremarkable. Joint space in the hips is preserved and symmetric. AP and frog-leg lateral views of the left hip show no evidence for femoral neck fracture. IMPRESSION: No acute findings to explain the patient's history of  pain. Electronically Signed   By: Misty Stanley M.D.   On: 04/27/2015 16:11     MDM   1. Left-sided low back pain with left-sided sciatica   2. Arthritis, lumbar spine   3. Elevated blood pressure    Pt c/o Left lower back pain that radiates down his Left thigh.  Hx and exam c/w Left sided sciatica. No red flag symptoms, however, mild lumbar tenderness on exam. Will start with plain films.   Plain films: mild degenerative changes in L4-5 and L5-S1, no acute findings.  Discussed imaging with pt.  Pt has pain medication prescribed by his PCP today he plans to start taking this evening.  Unsure if he was prescribed steroids.  Rx: prednisone.   Due to worker's comp case, prescription for referral to Sports Medicine/Orthopedist written and provided to occupational health.  Home care instructions with exercises provided.  Work note provided for pt to return 05/01/15 with 5 days of light duty.  Patient verbalized understanding and agreement with treatment plan.      Noland Fordyce, PA-C 04/27/15 1635

## 2015-04-27 NOTE — Telephone Encounter (Signed)
Patient Name: Eric Mann DOB: 11-14-1960 Initial Comment Caller states he thought he pulled muscle in back, but woke with radiating back pain and numbness in thigh Nurse Assessment Nurse: Marcelline Deist, RN, Kermit Balo Date/Time (Eastern Time): 04/27/2015 10:27:51 AM Confirm and document reason for call. If symptomatic, describe symptoms. You must click the next button to save text entered. ---Caller states he thought he pulled muscle in left area of back on Friday at work, but woke with radiating back pain and a hard pain down his side into the thigh. Seen Monday, given anti inflammatory rx. The medication has not helped. The pain has been off & on, worse with sitting & changing positions. The pain is dull & hard. Has the patient traveled out of the country within the last 30 days? ---Not Applicable Does the patient have any new or worsening symptoms? ---Yes Will a triage be completed? ---Yes Related visit to physician within the last 2 weeks? ---Yes Does the PT have any chronic conditions? (i.e. diabetes, asthma, etc.) ---Yes List chronic conditions. ---on BP rx, gout rx Is this a behavioral health or substance abuse call? ---No Guidelines Guideline Title Affirmed Question Affirmed Notes Back Pain Weakness of a leg or foot (e.g., unable to bear weight, dragging foot) Final Disposition User Go to ED Now (or PCP triage) Marcelline Deist, RN, Lynda Comments Caller states since this was related to his job, he will contact his employer & go through the worker's compensation facility for this. Referrals GO TO FACILITY UNDECIDED Disagree/Comply: Comply

## 2015-04-30 ENCOUNTER — Encounter (HOSPITAL_COMMUNITY): Payer: Self-pay | Admitting: *Deleted

## 2015-04-30 ENCOUNTER — Emergency Department (HOSPITAL_COMMUNITY)
Admission: EM | Admit: 2015-04-30 | Discharge: 2015-04-30 | Disposition: A | Discharge status: Home / Self Care | Payer: 59 | Attending: Emergency Medicine | Admitting: Emergency Medicine

## 2015-04-30 DIAGNOSIS — R51 Headache: Secondary | ICD-10-CM

## 2015-04-30 DIAGNOSIS — I10 Essential (primary) hypertension: Secondary | ICD-10-CM | POA: Diagnosis not present

## 2015-04-30 DIAGNOSIS — Z79899 Other long term (current) drug therapy: Secondary | ICD-10-CM | POA: Diagnosis not present

## 2015-04-30 DIAGNOSIS — Z7952 Long term (current) use of systemic steroids: Secondary | ICD-10-CM | POA: Diagnosis not present

## 2015-04-30 DIAGNOSIS — R519 Headache, unspecified: Secondary | ICD-10-CM

## 2015-04-30 DIAGNOSIS — R110 Nausea: Secondary | ICD-10-CM

## 2015-04-30 DIAGNOSIS — M109 Gout, unspecified: Secondary | ICD-10-CM | POA: Diagnosis not present

## 2015-04-30 MED ORDER — ONDANSETRON 4 MG PO TBDP
8.0000 mg | ORAL_TABLET | Freq: Once | ORAL | Status: AC
  Administered 2015-04-30: 8 mg via ORAL
  Filled 2015-04-30: qty 2

## 2015-04-30 MED ORDER — KETOROLAC TROMETHAMINE 30 MG/ML IJ SOLN
60.0000 mg | Freq: Once | INTRAMUSCULAR | Status: AC
  Administered 2015-04-30: 60 mg via INTRAMUSCULAR
  Filled 2015-04-30: qty 2

## 2015-04-30 MED ORDER — ONDANSETRON 4 MG PO TBDP: 4.0000 mg | ORAL_TABLET | Freq: Three times a day (TID) | ORAL | Status: DC | PRN

## 2015-04-30 NOTE — ED Notes (Signed)
Pt c/o headache since yesterday. States that last night his BP was 177/104. States he takes amlodipine at night. States he took his last night and tonight but his BP and headache have stayed.

## 2015-04-30 NOTE — ED Provider Notes (Signed)
CSN: [id number]     Arrival date & time 04/30/15  0056 History   First MD Initiated Contact with Patient 04/30/15 [phone]     Chief Complaint  Patient presents with  . Headache     (Consider location/radiation/quality/duration/timing/severity/associated sxs/prior Treatment) HPI Comments: Eric Mann is a 55 y.o. male with a PMHx of gout and HTN, who presents to the ED with complaints of gradual onset headache 1.5 days. He states that yesterday (04/28/15) he went to [facility] to check his blood pressure, it was 177/104 despite taking his amlodipine, and last night (04/29/15) it was in the 150s/90s and he took his blood pressure medication again, but noted that he had had a headache during this time and despite taking the blood pressure medications his blood pressure still high and his headache still present. He describes the headache as 6/10 constant generalized throbbing, nonradiating, worse with activity, and unrelieved with ibuprofen, although he states that overall it has lessened in severity some. Associated symptoms include mild nausea. Upon arrival his blood pressure was 151/94. He denies history of headaches.  He denies any fevers, chills, chest pain, shortness breath, neck stiffness or pain, abdominal pain, vomiting, diarrhea, constipation, dysuria, hematuria, myalgias, arthralgias, numbness, tingling, weakness, rashes, tinnitus, vision changes, lightheadedness, or syncope. He denies any recent tick bites or head injuries. Was recently started on prednisone 3 days ago and isn't sure if maybe that was the cause of his issues.  Patient is a 55 y.o. male presenting with headaches. The history is provided by the patient and medical records. No language interpreter was used.  Headache Pain location:  Generalized Quality: throbbing. Radiates to:  Does not radiate Severity currently:  6/10 Onset quality:  Gradual Duration:  1 day Timing:  Constant Progression:  Improving Chronicity:   New Relieved by:  Nothing Worsened by:  Activity Ineffective treatments:  NSAIDs Associated symptoms: no abdominal pain, no blurred vision, no diarrhea, no fever, no focal weakness, no loss of balance, no myalgias, no nausea, no neck stiffness, no numbness, no paresthesias, no photophobia, no syncope, no tingling, no vomiting and no weakness     Past Medical History  Diagnosis Date  . Gout   . Hypertension   . Gout    History reviewed. No pertinent past surgical history. Family History  Problem Relation Age of Onset  . Heart disease Mother   . Hypertension Mother   . Diabetes Mother   . Diabetes Sister   . Cancer Sister     cervix cancer  . Diabetes Brother   . Colon cancer Neg Hx   . Esophageal cancer Neg Hx   . Rectal cancer Neg Hx   . Stomach cancer Neg Hx    Social History  Substance Use Topics  . Smoking status: Never Smoker   . Smokeless tobacco: Never Used  . Alcohol Use: No    Review of Systems  Constitutional: Negative for fever and chills.  HENT: Negative for tinnitus.   Eyes: Negative for blurred vision, photophobia and visual disturbance.  Respiratory: Negative for shortness of breath.   Cardiovascular: Negative for chest pain and syncope.  Gastrointestinal: Negative for nausea, vomiting, abdominal pain, diarrhea and constipation.  Genitourinary: Negative for dysuria and hematuria.  Musculoskeletal: Negative for myalgias, arthralgias and neck stiffness.  Skin: Negative for rash.  Allergic/Immunologic: Negative for immunocompromised state.  Neurological: Positive for headaches. Negative for focal weakness, syncope, weakness, light-headedness, numbness, paresthesias and loss of balance.  Psychiatric/Behavioral: Negative for confusion.   10  Systems reviewed and are negative for acute change except as noted in the HPI.    Allergies  Review of patient's allergies indicates no known allergies.  Home Medications   Prior to Admission medications    Medication Sig Start Date End Date Taking? Authorizing Provider  allopurinol (ZYLOPRIM) 300 MG tablet TAKE 1 TABLET BY MOUTH DAILY 03/05/15   Eulas Post, MD  amLODipine (NORVASC) 5 MG tablet TAKE 1 TABLET BY MOUTH EVERY DAY 05/08/14   Eulas Post, MD  colchicine 0.6 MG tablet Take two tablets by mouth at onset then one tablet 2 times daily as needed. 02/07/14   Eulas Post, MD  diazepam (VALIUM) 5 MG tablet Take 1 tablet (5 mg total) by mouth every 8 (eight) hours as needed for anxiety. 04/19/15   Montine Circle, PA-C  diclofenac (VOLTAREN) 75 MG EC tablet Take 1 tablet (75 mg total) by mouth 2 (two) times daily. 04/23/15   Eulas Post, MD  methocarbamol (ROBAXIN) 500 MG tablet Take 1 tablet (500 mg total) by mouth every 8 (eight) hours as needed for muscle spasms. 04/26/15   Eulas Post, MD  predniSONE (DELTASONE) 20 MG tablet 3 tabs po day one, then 2 po daily x 4 days 04/27/15   Noland Fordyce, PA-C   BP 135/83 mmHg  Pulse 57  Temp(Src) 98 F (36.7 C) (Oral)  Resp 17  Ht 6\' 3"  (1.905 m)  Wt 147.419 kg  BMI 40.62 kg/m2  SpO2 96% Physical Exam  Constitutional: He is oriented to person, place, and time. Vital signs are normal. He appears well-developed and well-nourished.  Non-toxic appearance. No distress.  Afebrile, nontoxic, NAD, laughing with son at bedside; BP 135/83  HENT:  Head: Normocephalic and atraumatic.  Mouth/Throat: Oropharynx is clear and moist and mucous membranes are normal.  Eyes: Conjunctivae and EOM are normal. Pupils are equal, round, and reactive to light. Right eye exhibits no discharge. Left eye exhibits no discharge.  PERRL, EOMI, no nystagmus, no visual field deficits   Neck: Normal range of motion. Neck supple. No spinous process tenderness and no muscular tenderness present. No rigidity. Normal range of motion present.  FROM intact without spinous process TTP, no bony stepoffs or deformities, no paraspinous muscle TTP or muscle spasms. No  rigidity or meningeal signs. No bruising or swelling.   Cardiovascular: Normal rate, regular rhythm, normal heart sounds and intact distal pulses.  Exam reveals no gallop and no friction rub.   No murmur heard. Pulmonary/Chest: Effort normal and breath sounds normal. No respiratory distress. He has no decreased breath sounds. He has no wheezes. He has no rhonchi. He has no rales.  Abdominal: Soft. Normal appearance and bowel sounds are normal. He exhibits no distension. There is no tenderness. There is no rigidity, no rebound, no guarding, no CVA tenderness, no tenderness at McBurney's point and negative Murphy's sign.  Musculoskeletal: Normal range of motion.  MAE x4 Strength and sensation grossly intact Distal pulses intact Gait steady  Neurological: He is alert and oriented to person, place, and time. He has normal strength. No cranial nerve deficit or sensory deficit. Coordination and gait normal. GCS eye subscore is 4. GCS verbal subscore is 5. GCS motor subscore is 6.  CN 2-12 grossly intact A&O x4 GCS 15 Sensation and strength intact Gait nonataxic including with tandem walking Coordination with finger-to-nose WNL Neg pronator drift   Skin: Skin is warm, dry and intact. No rash noted.  Psychiatric: He has a normal  mood and affect.  Nursing note and vitals reviewed.   ED Course  Procedures (including critical care time) Labs Review Labs Reviewed - No data to display  Imaging Review No results found. I have personally reviewed and evaluated these images and lab results as part of my medical decision-making.   EKG Interpretation None      MDM   Final diagnoses:  Acute nonintractable headache, unspecified headache type  Nausea  HTN (hypertension), benign    55 y.o. male here with headache and concerns that his BP was high. Gradual onset headache, no red flag s/sx, no focal neuro deficits. Pt states the HA has somewhat improved, and his BP has come down with his normal  home medications. Doubt concerning secondary etiologies for headache, doubt need for labs/imaging. Discussed that prednisone can increase his BP some, and this could have caused the transiently higher BP readings. When I discussed the option of migraine cocktail meds, but he would need a ride, he declined this offer, stated he would rather opt for IM toradol and ODT zofran for his symptoms. Will send home with zofran rx as well. Discussed staying hydrated, given that he didn't want any IVFs here. F/up with PCP in 3-5 days for ongoing management. I explained the diagnosis and have given explicit precautions to return to the ER including for any other new or worsening symptoms. The patient understands and accepts the medical plan as it's been dictated and I have answered their questions. Discharge instructions concerning home care and prescriptions have been given. The patient is STABLE and is discharged to home in good condition.    BP 135/83 mmHg  Pulse 57  Temp(Src) 98 F (36.7 C) (Oral)  Resp 17  Ht 6\' 3"  (1.905 m)  Wt 147.419 kg  BMI 40.62 kg/m2  SpO2 96%  Meds ordered this encounter  Medications  . ketorolac (TORADOL) 30 MG/ML injection 60 mg    Sig:   . ondansetron (ZOFRAN-ODT) disintegrating tablet 8 mg    Sig:   . ondansetron (ZOFRAN ODT) 4 MG disintegrating tablet    Sig: Take 1 tablet (4 mg total) by mouth every 8 (eight) hours as needed for nausea or vomiting.    Dispense:  15 tablet    Refill:  0    Order Specific Question:  Supervising Provider    Answer:  Noemi Chapel [[id number]]     Anna Beaird Camprubi-Soms, PA-C 04/30/15 0700  Merryl Hacker, MD 04/30/15 [phone]

## 2015-04-30 NOTE — Discharge Instructions (Signed)
Use Ibuprofen/Motrin/Aleve and Tylenol as needed for pain. Use zofran as needed for nausea. Stay well hydrated. Get plenty of rest. Take your regular blood pressure medications as directed. Monitor your blood pressure daily and keep a log in order to take it to your regular doctor at your next visit in order to see if your medications need to be adjusted. Follow up with your regular doctor in 3-5 days for recheck of symptoms and ongoing management of your headaches. Return to the ER for changes or worsening symptoms.   General Headache Without Cause A headache is pain or discomfort felt around the head or neck area. The specific cause of a headache may not be found. There are many causes and types of headaches. A few common ones are:  Tension headaches.  Migraine headaches.  Cluster headaches.  Chronic daily headaches. HOME CARE INSTRUCTIONS  Watch your condition for any changes. Take these steps to help with your condition: Managing Pain  Take over-the-counter and prescription medicines only as told by your health care provider.  Lie down in a dark, quiet room when you have a headache.  If directed, apply ice to the head and neck area:  Put ice in a plastic bag.  Place a towel between your skin and the bag.  Leave the ice on for 20 minutes, 2-3 times per day.  Use a heating pad or hot shower to apply heat to the head and neck area as told by your health care provider.  Keep lights dim if bright lights bother you or make your headaches worse. Eating and Drinking  Eat meals on a regular schedule.  Limit alcohol use.  Decrease the amount of caffeine you drink, or stop drinking caffeine. General Instructions  Keep all follow-up visits as told by your health care provider. This is important.  Keep a headache journal to help find out what may trigger your headaches. For example, write down:  What you eat and drink.  How much sleep you get.  Any change to your diet or  medicines.  Try massage or other relaxation techniques.  Limit stress.  Sit up straight, and do not tense your muscles.  Do not use tobacco products, including cigarettes, chewing tobacco, or e-cigarettes. If you need help quitting, ask your health care provider.  Exercise regularly as told by your health care provider.  Sleep on a regular schedule. Get 7-9 hours of sleep, or the amount recommended by your health care provider. SEEK MEDICAL CARE IF:   Your symptoms are not helped by medicine.  You have a headache that is different from the usual headache.  You have nausea or you vomit.  You have a fever. SEEK IMMEDIATE MEDICAL CARE IF:   Your headache becomes severe.  You have repeated vomiting.  You have a stiff neck.  You have a loss of vision.  You have problems with speech.  You have pain in the eye or ear.  You have muscular weakness or loss of muscle control.  You lose your balance or have trouble walking.  You feel faint or pass out.  You have confusion.   This information is not intended to replace advice given to you by your health care provider. Make sure you discuss any questions you have with your health care provider.   Document Released: 02/03/2005 Document Revised: 10/25/2014 Document Reviewed: 05/29/2014 Elsevier Interactive Patient Education 2016 [employer].  Hypertension Hypertension is another name for high blood pressure. High blood pressure forces your heart  to work harder to pump blood. A blood pressure reading has two numbers, which includes a higher number over a lower number (example: 110/72). HOME CARE   Have your blood pressure rechecked by your doctor.  Only take medicine as told by your doctor. Follow the directions carefully. The medicine does not work as well if you skip doses. Skipping doses also puts you at risk for problems.  Do not smoke.  Monitor your blood pressure at home as told by your doctor. GET HELP IF:  You  think you are having a reaction to the medicine you are taking.  You have repeat headaches or feel dizzy.  You have puffiness (swelling) in your ankles.  You have trouble with your vision. GET HELP RIGHT AWAY IF:   You get a very bad headache and are confused.  You feel weak, numb, or faint.  You get chest or belly (abdominal) pain.  You throw up (vomit).  You cannot breathe very well. MAKE SURE YOU:   Understand these instructions.  Will watch your condition.  Will get help right away if you are not doing well or get worse.   This information is not intended to replace advice given to you by your health care provider. Make sure you discuss any questions you have with your health care provider.   Document Released: 07/23/2007 Document Revised: 02/08/2013 Document Reviewed: 11/26/2012 Elsevier Interactive Patient Education 2016 [employer].  How to Take Your Blood Pressure HOW DO I GET A BLOOD PRESSURE MACHINE?  You can buy an electronic home blood pressure machine at your local pharmacy. Insurance will sometimes cover the cost if you have a prescription.  Ask your doctor what type of machine is best for you. There are different machines for your arm and your wrist.  If you decide to buy a machine to check your blood pressure on your arm, first check the size of your arm so you can buy the right size cuff. To check the size of your arm:   Use a measuring tape that shows both inches and centimeters.   Wrap the measuring tape around the upper-middle part of your arm. You may need someone to help you measure.   Write down your arm measurement in both inches and centimeters.   To measure your blood pressure correctly, it is important to have the right size cuff.   If your arm is up to 13 inches (up to 34 centimeters), get an adult cuff size.  If your arm is 13 to 17 inches (35 to 44 centimeters), get a large adult cuff size.    If your arm is 17 to 20 inches (45  to 52 centimeters), get an adult thigh cuff.  WHAT DO THE NUMBERS MEAN?   There are two numbers that make up your blood pressure. For example: 120/80.  The first number (120 in our example) is called the "systolic pressure." It is a measure of the pressure in your blood vessels when your heart is pumping blood.  The second number (80 in our example) is called the "diastolic pressure." It is a measure of the pressure in your blood vessels when your heart is resting between beats.  Your doctor will tell you what your blood pressure should be. WHAT SHOULD I DO BEFORE I CHECK MY BLOOD PRESSURE?   Try to rest or relax for at least 30 minutes before you check your blood pressure.  Do not smoke.  Do not have any drinks with caffeine,  such as:  Soda.  Coffee.  Tea.  Check your blood pressure in a quiet room.  Sit down and stretch out your arm on a table. Keep your arm at about the level of your heart. Let your arm relax.  Make sure that your legs are not crossed. HOW DO I CHECK MY BLOOD PRESSURE?  Follow the directions that came with your machine.  Make sure you remove any tight-fitting clothing from your arm or wrist. Wrap the cuff around your upper arm or wrist. You should be able to fit a finger between the cuff and your arm. If you cannot fit a finger between the cuff and your arm, it is too tight and should be removed and rewrapped.  Some units require you to manually pump up the arm cuff.  Automatic units inflate the cuff when you press a button.  Cuff deflation is automatic in both models.  After the cuff is inflated, the unit measures your blood pressure and pulse. The readings are shown on a monitor. Hold still and breathe normally while the cuff is inflated.  Getting a reading takes less than a minute.  Some models store readings in a memory. Some provide a printout of readings. If your machine does not store your readings, keep a written record.  Take readings with  you to your next visit with your doctor.   This information is not intended to replace advice given to you by your health care provider. Make sure you discuss any questions you have with your health care provider.   Document Released: 01/17/2008 Document Revised: 02/24/2014 Document Reviewed: 03/31/2013 Elsevier Interactive Patient Education 2016 Elsevier Inc.  Nausea, Adult Nausea means you feel sick to your stomach or need to throw up (vomit). It may be a sign of a more serious problem. If nausea gets worse, you may throw up. If you throw up a lot, you may lose too much body fluid (dehydration). HOME CARE   Get plenty of rest.  Ask your doctor how to replace body fluid losses (rehydrate).  Eat small amounts of food. Sip liquids more often.  Take all medicines as told by your doctor. GET HELP RIGHT AWAY IF:  You have a fever.  You pass out (faint).  You keep throwing up or have blood in your throw up.  You are very weak, have dry lips or a dry mouth, or you are very thirsty (dehydrated).  You have dark or bloody poop (stool).  You have very bad chest or belly (abdominal) pain.  You do not get better after 2 days, or you get worse.  You have a headache. MAKE SURE YOU:  Understand these instructions.  Will watch your condition.  Will get help right away if you are not doing well or get worse.   This information is not intended to replace advice given to you by your health care provider. Make sure you discuss any questions you have with your health care provider.   Document Released: 01/23/2011 Document Revised: 04/28/2011 Document Reviewed: 01/23/2011 Elsevier Interactive Patient Education [employer].

## 2015-05-02 ENCOUNTER — Telehealth: Payer: Self-pay | Admitting: Family Medicine

## 2015-05-02 NOTE — Telephone Encounter (Signed)
Pt been having a headache for about 5 days I  used a SDA  For tomorrow 10:30 am .

## 2015-05-03 ENCOUNTER — Ambulatory Visit (INDEPENDENT_AMBULATORY_CARE_PROVIDER_SITE_OTHER): Payer: 59 | Admitting: Family Medicine

## 2015-05-03 ENCOUNTER — Other Ambulatory Visit: Payer: Self-pay | Admitting: Family Medicine

## 2015-05-03 VITALS — BP 140/90 | HR 63 | Temp 98.2°F | Ht 75.0 in | Wt 328.0 lb

## 2015-05-03 DIAGNOSIS — R519 Headache, unspecified: Secondary | ICD-10-CM

## 2015-05-03 DIAGNOSIS — I10 Essential (primary) hypertension: Secondary | ICD-10-CM | POA: Diagnosis not present

## 2015-05-03 DIAGNOSIS — R51 Headache: Secondary | ICD-10-CM

## 2015-05-03 MED ORDER — AMLODIPINE BESYLATE 10 MG PO TABS: 10.0000 mg | ORAL_TABLET | Freq: Every day | ORAL | Status: DC

## 2015-05-03 NOTE — Progress Notes (Signed)
   Subjective:    Patient ID: Eric Mann, male    DOB: 07-08-1960, 55 y.o.   MRN: [id number]  HPI  Patient seen with some persistent headache.  Was seen here recently with back pain. He was placed on diclofenac and back pain eventually improved. He developed some bifrontal headache which is dull and achy quality on Saturday, March 11. No fever. No sinus congestive symptoms. He went to a local [facility] and had blood pressure reading of 177/104.  He tried some ibuprofen without relief.  Patient was seen in emergency department on March 13 for evaluation. Blood pressure was initially elevated but came down during the visit. He was given Toradol injection without relief. Current headache is 5-6 out of 10 severity. Denies any visual changes. No nausea or vomiting. No cognitive changes. No recent head injury. No focal weakness. Patient did abruptly stop drinking caffeine last week. He been drinking diet Pepsi's with caffeine several per day and stopped abruptly.  Past Medical History  Diagnosis Date  . Gout   . Hypertension   . Gout    No past surgical history on file.  reports that he has never smoked. He has never used smokeless tobacco. He reports that he does not drink alcohol or use illicit drugs. family history includes Cancer in his sister; Diabetes in his brother, mother, and sister; Heart disease in his mother; Hypertension in his mother. There is no history of Colon cancer, Esophageal cancer, Rectal cancer, or Stomach cancer. No Known Allergies    Review of Systems  Constitutional: Negative for fever and chills.  HENT: Negative for congestion and sinus pressure.   Eyes: Negative for visual disturbance.  Respiratory: Negative for shortness of breath.   Cardiovascular: Negative for chest pain.  Gastrointestinal: Negative for nausea and vomiting.  Neurological: Positive for headaches.       Objective:   Physical Exam  Constitutional: He is oriented to person, place, and time. He  appears well-developed and well-nourished.  Eyes: Pupils are equal, round, and reactive to light.  Neck: Neck supple. No thyromegaly present.  Cardiovascular: Normal rate and regular rhythm.   Pulmonary/Chest: Effort normal and breath sounds normal. No respiratory distress. He has no wheezes.  Neurological: He is alert and oriented to person, place, and time. No cranial nerve deficit.          Assessment & Plan:   Persistent daily headache. Question tension-type. Possibly partly related to caffeine withdrawal. Avoid daily analgesics. Taper off caffeine gradually. We discussed the possibility that his headaches may also be related to recent poorly controlled blood pressure. Titrate amlodipine to 10 mg daily. Continue weight loss efforts. Touch base in one week if headaches not fully resolving.

## 2015-05-03 NOTE — Progress Notes (Signed)
Pre visit review using our clinic review tool, if applicable. No additional management support is needed unless otherwise documented below in the visit note. 

## 2015-05-03 NOTE — Patient Instructions (Signed)
General Headache Without Cause A headache is pain or discomfort felt around the head or neck area. The specific cause of a headache may not be found. There are many causes and types of headaches. A few common ones are:  Tension headaches.  Migraine headaches.  Cluster headaches.  Chronic daily headaches. HOME CARE INSTRUCTIONS  Watch your condition for any changes. Take these steps to help with your condition: Managing Pain  Take over-the-counter and prescription medicines only as told by your health care provider.  Lie down in a dark, quiet room when you have a headache.  If directed, apply ice to the head and neck area:  Put ice in a plastic bag.  Place a towel between your skin and the bag.  Leave the ice on for 20 minutes, 2-3 times per day.  Use a heating pad or hot shower to apply heat to the head and neck area as told by your health care provider.  Keep lights dim if bright lights bother you or make your headaches worse. Eating and Drinking  Eat meals on a regular schedule.  Limit alcohol use.  Decrease the amount of caffeine you drink, or stop drinking caffeine. General Instructions  Keep all follow-up visits as told by your health care provider. This is important.  Keep a headache journal to help find out what may trigger your headaches. For example, write down:  What you eat and drink.  How much sleep you get.  Any change to your diet or medicines.  Try massage or other relaxation techniques.  Limit stress.  Sit up straight, and do not tense your muscles.  Do not use tobacco products, including cigarettes, chewing tobacco, or e-cigarettes. If you need help quitting, ask your health care provider.  Exercise regularly as told by your health care provider.  Sleep on a regular schedule. Get 7-9 hours of sleep, or the amount recommended by your health care provider. SEEK MEDICAL CARE IF:   Your symptoms are not helped by medicine.  You have a  headache that is different from the usual headache.  You have nausea or you vomit.  You have a fever. SEEK IMMEDIATE MEDICAL CARE IF:   Your headache becomes severe.  You have repeated vomiting.  You have a stiff neck.  You have a loss of vision.  You have problems with speech.  You have pain in the eye or ear.  You have muscular weakness or loss of muscle control.  You lose your balance or have trouble walking.  You feel faint or pass out.  You have confusion.   This information is not intended to replace advice given to you by your health care provider. Make sure you discuss any questions you have with your health care provider.   Document Released: 02/03/2005 Document Revised: 10/25/2014 Document Reviewed: 05/29/2014 Elsevier Interactive Patient Education 2016 [employer].  Try to GRADUALLY decrease your caffeine use.

## 2015-05-11 ENCOUNTER — Emergency Department
Admission: EM | Admit: 2015-05-11 | Discharge: 2015-05-11 | Discharge status: Absent without leave | Payer: Self-pay | Source: Home / Self Care

## 2015-06-27 ENCOUNTER — Other Ambulatory Visit: Payer: Self-pay | Admitting: *Deleted

## 2015-06-27 MED ORDER — ALLOPURINOL 300 MG PO TABS
300.0000 mg | ORAL_TABLET | Freq: Every day | ORAL | Status: DC
Start: 2015-06-27 — End: 2015-06-29

## 2015-06-27 MED ORDER — AMLODIPINE BESYLATE 10 MG PO TABS: 10.0000 mg | ORAL_TABLET | Freq: Every day | ORAL | Status: DC

## 2015-06-29 ENCOUNTER — Other Ambulatory Visit: Payer: Self-pay | Admitting: *Deleted

## 2015-06-29 MED ORDER — ALLOPURINOL 300 MG PO TABS: 300.0000 mg | ORAL_TABLET | Freq: Every day | ORAL | Status: DC

## 2015-06-29 MED ORDER — AMLODIPINE BESYLATE 10 MG PO TABS: 10.0000 mg | ORAL_TABLET | Freq: Every day | ORAL | Status: DC

## 2016-07-21 ENCOUNTER — Other Ambulatory Visit (HOSPITAL_COMMUNITY)
Admission: RE | Admit: 2016-07-21 | Discharge: 2016-07-21 | Disposition: A | Discharge status: Home / Self Care | Payer: 59 | Source: Ambulatory Visit | Attending: Family Medicine | Admitting: Family Medicine

## 2016-07-21 ENCOUNTER — Encounter: Payer: Self-pay | Admitting: Family Medicine

## 2016-07-21 ENCOUNTER — Ambulatory Visit (INDEPENDENT_AMBULATORY_CARE_PROVIDER_SITE_OTHER): Payer: 59 | Admitting: Family Medicine

## 2016-07-21 VITALS — BP 138/86 | Temp 98.2°F | Ht 75.0 in | Wt 319.0 lb

## 2016-07-21 DIAGNOSIS — Z209 Contact with and (suspected) exposure to unspecified communicable disease: Secondary | ICD-10-CM | POA: Insufficient documentation

## 2016-07-21 MED ORDER — METRONIDAZOLE 500 MG PO TABS: 500.0000 mg | ORAL_TABLET | Freq: Three times a day (TID) | ORAL | 0 refills | Status: DC

## 2016-07-21 NOTE — Patient Instructions (Signed)
WE NOW OFFER   [facility] FAST TRACK!!!  SAME DAY Appointments for ACUTE CARE  Such as: Sprains, Injuries, cuts, abrasions, rashes, muscle pain, joint pain, back pain Colds, flu, sore throats, headache, allergies, cough, fever  Ear pain, sinus and eye infections Abdominal pain, nausea, vomiting, diarrhea, upset stomach Animal/insect bites  3 Easy Ways to Schedule: Walk-In Scheduling Call in scheduling Mychart Sign-up: [email or web address]         

## 2016-07-21 NOTE — Progress Notes (Signed)
   Subjective:    Patient ID: Eric Mann, male    DOB: 07/27/1960, 56 y.o.   MRN: [id number]  HPI Here for exposure to Trichomonas. He had intercourse last week with someone who has informed him that she is being treated for this. He has no symptoms.    Review of Systems  Constitutional: Negative.   Genitourinary: Negative.        Objective:   Physical Exam  Constitutional: He appears well-developed and well-nourished.  Cardiovascular: Normal rate, regular rhythm, normal heart sounds and intact distal pulses.   Pulmonary/Chest: Effort normal and breath sounds normal.          Assessment & Plan:  Exposure to Trichomonas. Treat with Flagyl for 7 days. Test for this and other STDs.  Alysia Penna, MD

## 2016-07-22 LAB — GC/CHLAMYDIA PROBE AMP
CT PROBE, AMP APTIMA: NOT DETECTED
GC PROBE AMP APTIMA: NOT DETECTED

## 2016-07-22 LAB — URINE CYTOLOGY ANCILLARY ONLY: TRICH (WINDOWPATH): POSITIVE — AB

## 2016-07-22 LAB — HIV ANTIBODY (ROUTINE TESTING W REFLEX): HIV 1&2 Ab, 4th Generation: NONREACTIVE

## 2016-07-22 LAB — RPR

## 2016-11-06 ENCOUNTER — Encounter: Payer: Self-pay | Admitting: Family Medicine

## 2016-12-23 ENCOUNTER — Encounter: Payer: Self-pay | Admitting: Family Medicine

## 2016-12-26 ENCOUNTER — Ambulatory Visit (INDEPENDENT_AMBULATORY_CARE_PROVIDER_SITE_OTHER): Payer: 59 | Admitting: Family Medicine

## 2016-12-26 ENCOUNTER — Encounter: Payer: Self-pay | Admitting: Family Medicine

## 2016-12-26 VITALS — BP 124/90 | HR 61 | Temp 98.1°F | Wt 327.5 lb

## 2016-12-26 DIAGNOSIS — M1A0790 Idiopathic chronic gout, unspecified ankle and foot, without tophus (tophi): Secondary | ICD-10-CM | POA: Diagnosis not present

## 2016-12-26 MED ORDER — COLCHICINE 0.6 MG PO TABS: 0.6000 mg | ORAL_TABLET | Freq: Every day | ORAL | 2 refills | Status: DC

## 2016-12-26 MED ORDER — ALLOPURINOL 300 MG PO TABS: 300.0000 mg | ORAL_TABLET | Freq: Every day | ORAL | 3 refills | Status: DC

## 2016-12-26 MED ORDER — PREDNISONE 20 MG PO TABS: ORAL_TABLET | ORAL | 0 refills | Status: DC

## 2016-12-26 NOTE — Progress Notes (Signed)
Subjective:     Patient ID: Eric Mann, male   DOB: Oct 15, 1960, 56 y.o.   MRN: [id number]  HPI Left foot pain involving metatarsophalangeal joint. Patient has known history of gout. He was doing well and taking allopurinol but ran out of prescription little over a week ago. He's had one day of left foot redness swelling and pain. No injury. No fevers or chills. Has taken Colcrys in the past  Past Medical History:  Diagnosis Date  . Gout   . Gout   . Hypertension    No past surgical history on file.  reports that  has never smoked. he has never used smokeless tobacco. He reports that he does not drink alcohol or use drugs. family history includes Cancer in his sister; Diabetes in his brother, mother, and sister; Heart disease in his mother; Hypertension in his mother. No Known Allergies   Review of Systems  Constitutional: Negative for chills and fever.       Objective:   Physical Exam  Constitutional: He appears well-developed and well-nourished.  Cardiovascular: Normal rate and regular rhythm.  Musculoskeletal:  Left foot reveals some mild swelling of the metatarsophalangeal joint with mild erythema and tenderness to palpation. No ulcerations.       Assessment:     Acute gout involving left foot metatarsophalangeal joint. Recently ran out of allopurinol    Plan:     -Prednisone 20 mg 2 tablets daily for 5 days -start back Colcrys 0.6 mg twice daily until acute gout subsided and then take once daily until back on allopurinol regularly for a few weeks -Will start back allopurinol but only after acute flareups has subsided for couple weeks -pt will set up CPE and would check uric acid at follow up after back on the Allopurinol.    Eulas Post MD [facility] at [facility]

## 2016-12-26 NOTE — Patient Instructions (Signed)
Take the Colchicine two initially and then one twice daily until acute pain subsides Would wait to start the Allopurinol until at least 1-2 weeks after acute pain better Would take Colchicine one daily for at least the first 2 weeks of starting back Allopurinol.

## 2017-04-20 ENCOUNTER — Ambulatory Visit: Payer: Self-pay | Admitting: *Deleted

## 2017-04-20 NOTE — Telephone Encounter (Signed)
Patient has been taking his regular gout medication and it is not helping. He is in pain and has started limping. He is concerned that the symptoms may not be gout- but he has no way to check his Uric acid.

## 2017-04-20 NOTE — Telephone Encounter (Signed)
Patient thought he was having a gout flare- but the medication is not helping. He is  Reason for Disposition . [1] MODERATE pain (e.g., interferes with normal activities, limping) AND [2] present > 3 days  Answer Assessment - Initial Assessment Questions 1. LOCATION: "Where is the swelling located?"  (e.g., left, right, both knees)     Left knee 2. SIZE and DESCRIPTION: "What does the swelling look like?"  (e.g., entire knee, localized)     Swollen on top and sides of knee 3. ONSET: "When did the swelling start?" "Does it come and go, or is it there all the time?"     Swelling started 3 days ago- all the time 4. PAIN: "Is there any pain?" If so, ask: "How bad is it?" (Scale 1-10; or mild, moderate, severe)     Pain started 1 1/2 weeks ago- now more stiffness than pain. 6 rating 5. SETTING: "Has there been any recent work, exercise or other activity that involved that part of the body?"      No more activity than normal 6. AGGRAVATING FACTORS: "What makes the knee swelling worse?" (e.g., walking, climbing stairs, running)     Patient is on his feet at work- he is limping now 7. ASSOCIATED SYMPTOMS: "Is there any pain or redness?"     no 8. OTHER SYMPTOMS: "Do you have any other symptoms?" (e.g., chest pain, difficulty breathing, fever, calf pain)     no 9. PREGNANCY: "Is there any chance you are pregnant?" "When was your last menstrual period?"     n/a  Protocols used: [facility]

## 2017-04-21 ENCOUNTER — Ambulatory Visit (INDEPENDENT_AMBULATORY_CARE_PROVIDER_SITE_OTHER): Payer: 59 | Admitting: Family Medicine

## 2017-04-21 ENCOUNTER — Encounter: Payer: Self-pay | Admitting: Family Medicine

## 2017-04-21 VITALS — BP 148/84 | HR 66 | Temp 98.1°F | Wt 323.7 lb

## 2017-04-21 DIAGNOSIS — M25462 Effusion, left knee: Secondary | ICD-10-CM | POA: Diagnosis not present

## 2017-04-21 MED ORDER — PREDNISONE 10 MG PO TABS: ORAL_TABLET | ORAL | 0 refills | Status: DC

## 2017-04-21 NOTE — Progress Notes (Signed)
Subjective:     Patient ID: Eric Mann, male   DOB: 23-Apr-1960, 57 y.o.   MRN: [id number]  HPI Patient seen for acute visit with left knee effusion. Started about 2 weeks ago. Denies any specific injury. He does have history of gout but states he is not seeing quite as much warmth with this as he would normally see with gout. Denies any locking or giving way. Pain is very poorly localized. Has not noted any erythema. No fevers or chills. No alleviating factors.  Past Medical History:  Diagnosis Date  . Gout   . Gout   . Hypertension    No past surgical history on file.  reports that  has never smoked. he has never used smokeless tobacco. He reports that he does not drink alcohol or use drugs. family history includes Cancer in his sister; Diabetes in his brother, mother, and sister; Heart disease in his mother; Hypertension in his mother. No Known Allergies   Review of Systems  Constitutional: Negative for chills and fever.  Respiratory: Negative for shortness of breath.   Musculoskeletal: Positive for joint swelling (Left knee only).  Skin: Negative for rash.  Hematological: Negative for adenopathy.       Objective:   Physical Exam  Constitutional: He appears well-developed and well-nourished.  Cardiovascular: Normal rate and regular rhythm.  Pulmonary/Chest: Effort normal and breath sounds normal. No respiratory distress. He has no wheezes. He has no rales.  Musculoskeletal:  Patient has moderate effusion left knee. Full range of motion. No localized tenderness. Slight warmth compared to the right. No erythema. Ligament testing is difficult with his effusion       Assessment:     Left knee effusion of 2 weeks duration. He does have history of gout but states this feels somewhat different. Denies any injury as above.    Plan:     -We decided to go ahead and cover with prednisone taper and reassess in one week. -If not improving with above consider either arthrocentesis  and intra-articular steroid injection versus sports medicine referral.  Eulas Post MD [facility] at [facility]

## 2017-04-21 NOTE — Patient Instructions (Signed)
Knee Effusion Knee effusion means that you have excess fluid in your knee joint. This can cause pain and swelling in your knee. This may make your knee more difficult to bend and move. That is because there is increased pain and pressure in the joint. If there is fluid in your knee, it often means that something is wrong inside your knee, such as severe arthritis, abnormal inflammation, or an infection. Another common cause of knee effusion is an injury to the knee muscles, ligaments, or cartilage. Follow these instructions at home:  Use crutches as directed by your health care provider.  Wear a knee brace as directed by your health care provider.  Apply ice to the swollen area: ? Put ice in a plastic bag. ? Place a towel between your skin and the bag. ? Leave the ice on for 20 minutes, 2-3 times per day.  Keep your knee raised (elevated) when you are sitting or lying down.  Take medicines only as directed by your health care provider.  Do any rehabilitation or strengthening exercises as directed by your health care provider.  Rest your knee as directed by your health care provider. You may start doing your normal activities again when your health care provider approves.  Keep all follow-up visits as directed by your health care provider. This is important. Contact a health care provider if:  You have ongoing (persistent) pain in your knee. Get help right away if:  You have increased swelling or redness of your knee.  You have severe pain in your knee.  You have a fever. This information is not intended to replace advice given to you by your health care provider. Make sure you discuss any questions you have with your health care provider. Document Released: 04/26/2003 Document Revised: 07/12/2015 Document Reviewed: 09/19/2013 Elsevier Interactive Patient Education  2018 Elsevier Inc[employer]  

## 2017-04-22 ENCOUNTER — Ambulatory Visit: Payer: Self-pay | Admitting: Family Medicine

## 2017-04-28 ENCOUNTER — Ambulatory Visit (INDEPENDENT_AMBULATORY_CARE_PROVIDER_SITE_OTHER): Payer: 59 | Admitting: Family Medicine

## 2017-04-28 ENCOUNTER — Encounter: Payer: Self-pay | Admitting: Family Medicine

## 2017-04-28 ENCOUNTER — Other Ambulatory Visit: Payer: Self-pay | Admitting: Family Medicine

## 2017-04-28 VITALS — BP 142/82 | HR 69 | Temp 98.1°F | Wt 320.2 lb

## 2017-04-28 DIAGNOSIS — N529 Male erectile dysfunction, unspecified: Secondary | ICD-10-CM | POA: Diagnosis not present

## 2017-04-28 DIAGNOSIS — I10 Essential (primary) hypertension: Secondary | ICD-10-CM | POA: Diagnosis not present

## 2017-04-28 DIAGNOSIS — M25462 Effusion, left knee: Secondary | ICD-10-CM | POA: Diagnosis not present

## 2017-04-28 MED ORDER — AMLODIPINE BESYLATE 5 MG PO TABS: 5.0000 mg | ORAL_TABLET | Freq: Every day | ORAL | 3 refills | Status: DC

## 2017-04-28 MED ORDER — SILDENAFIL CITRATE 100 MG PO TABS: 100.0000 mg | ORAL_TABLET | Freq: Every day | ORAL | 11 refills | Status: DC | PRN

## 2017-04-28 MED ORDER — CELECOXIB 200 MG PO CAPS: 200.0000 mg | ORAL_CAPSULE | Freq: Every day | ORAL | 1 refills | Status: DC

## 2017-04-28 NOTE — Patient Instructions (Signed)
Set up complete physical We are setting up sports medicine referral.

## 2017-04-28 NOTE — Progress Notes (Signed)
Subjective:     Patient ID: Eric Mann, male   DOB: 08/16/1960, 57 y.o.   MRN: [id number]  HPI Patient seen for several issues [place] follows  Follow-up regarding left knee effusion. He does have history of gout but did not notice much warmth of the left knee typical of prior gout flareups. He noted fusion recently without recent known injury.  He took prednisone taper and swelling has improved somewhat but has not resolved. No locking or giving way.  Second issue is history of hypertension and has been off medications completely for several weeks. Ran out of prescription. Denies headache. No chest pains. Previously treated with amlodipine  Patient complains of erectile dysfunction. He has difficulty mostly maintaining erection but sometimes getting erection. No history of peripheral vascular disease. No history of diabetes. Nonsmoker. Symptoms have persisted even after discontinuation of amlodipine. Has never tried medications before  Past Medical History:  Diagnosis Date  . Gout   . Gout   . Hypertension    History reviewed. No pertinent surgical history.  reports that  has never smoked. he has never used smokeless tobacco. He reports that he does not drink alcohol or use drugs. family history includes Cancer in his sister; Diabetes in his brother, mother, and sister; Heart disease in his mother; Hypertension in his mother. No Known Allergies   Review of Systems  Constitutional: Negative for fatigue.  Eyes: Negative for visual disturbance.  Respiratory: Negative for cough, chest tightness and shortness of breath.   Cardiovascular: Negative for chest pain, palpitations and leg swelling.  Neurological: Negative for dizziness, syncope, weakness, light-headedness and headaches.       Objective:   Physical Exam  Constitutional: He is oriented to person, place, and time. He appears well-developed and well-nourished.  HENT:  Right Ear: External ear normal.  Left Ear: External ear  normal.  Mouth/Throat: Oropharynx is clear and moist.  Eyes: Pupils are equal, round, and reactive to light.  Neck: Neck supple. No thyromegaly present.  Cardiovascular: Normal rate and regular rhythm.  Pulmonary/Chest: Effort normal and breath sounds normal. No respiratory distress. He has no wheezes. He has no rales.  Musculoskeletal: He exhibits no edema.  Neurological: He is alert and oriented to person, place, and time.       Assessment:     #1 persistent left knee effusion. Doubt related to acute gout as patient had minimal improvement with prednisone taper  #2 hypertension currently untreated  #3 erectile dysfunction    Plan:     -Set up sports medicine referral to further evaluate his left knee effusion -Get back on amlodipine with prescription written today -Discuss trial of Viagra 100 mg one half to one tablet once daily as needed -Set up complete physical  Eulas Post MD [facility] at [facility]

## 2017-05-06 ENCOUNTER — Encounter: Payer: Self-pay | Admitting: Family Medicine

## 2017-05-06 ENCOUNTER — Ambulatory Visit (INDEPENDENT_AMBULATORY_CARE_PROVIDER_SITE_OTHER): Payer: 59 | Admitting: Family Medicine

## 2017-05-06 ENCOUNTER — Encounter: Payer: Self-pay | Admitting: Internal Medicine

## 2017-05-06 VITALS — BP 120/84 | HR 57 | Temp 98.6°F | Ht 74.5 in | Wt 317.4 lb

## 2017-05-06 DIAGNOSIS — Z0000 Encounter for general adult medical examination without abnormal findings: Secondary | ICD-10-CM | POA: Diagnosis not present

## 2017-05-06 DIAGNOSIS — M25462 Effusion, left knee: Secondary | ICD-10-CM | POA: Diagnosis not present

## 2017-05-06 LAB — TSH: TSH: 1.67 u[IU]/mL (ref 0.35–4.50)

## 2017-05-06 LAB — BASIC METABOLIC PANEL
BUN: 14 mg/dL (ref 6–23)
CALCIUM: 9.4 mg/dL (ref 8.4–10.5)
CO2: 27 mEq/L (ref 19–32)
Chloride: 109 mEq/L (ref 96–112)
Creatinine, Ser: 1.06 mg/dL (ref 0.40–1.50)
GFR: 92.7 mL/min (ref >60.00)
Glucose, Bld: 114 mg/dL — ABNORMAL HIGH (ref 70–99)
POTASSIUM: 4.3 meq/L (ref 3.5–5.1)
SODIUM: 150 meq/L — AB (ref 135–145)

## 2017-05-06 LAB — HEPATIC FUNCTION PANEL
ALT: 25 U/L (ref 0–53)
AST: 19 U/L (ref 0–37)
Albumin: 4.4 g/dL (ref 3.5–5.2)
Alkaline Phosphatase: 79 U/L (ref 39–117)
BILIRUBIN DIRECT: 0.1 mg/dL (ref 0.0–0.3)
BILIRUBIN TOTAL: 0.3 mg/dL (ref 0.2–1.2)
Total Protein: 7.1 g/dL (ref 6.0–8.3)

## 2017-05-06 LAB — CBC WITH DIFFERENTIAL/PLATELET
BASOS ABS: 0 10*3/uL (ref 0.0–0.1)
BASOS PCT: 0.4 % (ref 0.0–3.0)
EOS ABS: 0.1 10*3/uL (ref 0.0–0.7)
Eosinophils Relative: 2.5 % (ref 0.0–5.0)
HCT: 44.6 % (ref 39.0–52.0)
HEMOGLOBIN: 14.9 g/dL (ref 13.0–17.0)
Lymphocytes Relative: 36.1 % (ref 12.0–46.0)
Lymphs Abs: 1.8 10*3/uL (ref 0.7–4.0)
MCHC: 33.4 g/dL (ref 30.0–36.0)
MCV: 86.4 fl (ref 78.0–100.0)
MONO ABS: 0.5 10*3/uL (ref 0.1–1.0)
Monocytes Relative: 9.4 % (ref 3.0–12.0)
Neutro Abs: 2.6 10*3/uL (ref 1.4–7.7)
Neutrophils Relative %: 51.6 % (ref 43.0–77.0)
Platelets: 196 10*3/uL (ref 150.0–400.0)
RBC: 5.17 Mil/uL (ref 4.22–5.81)
RDW: 15.5 % (ref 11.5–15.5)
WBC: 5 10*3/uL (ref 4.0–10.5)

## 2017-05-06 LAB — LIPID PANEL
Cholesterol: 256 mg/dL — ABNORMAL HIGH (ref 0–200)
HDL: 60.2 mg/dL (ref >39.00)
LDL Cholesterol: 172 mg/dL — ABNORMAL HIGH (ref 0–99)
NonHDL: 195.68
TRIGLYCERIDES: 120 mg/dL (ref 0.0–149.0)
Total CHOL/HDL Ratio: 4
VLDL: 24 mg/dL (ref 0.0–40.0)

## 2017-05-06 LAB — PSA: PSA: 1.65 ng/mL (ref 0.10–4.00)

## 2017-05-06 MED ORDER — METHYLPREDNISOLONE ACETATE 80 MG/ML IJ SUSP: 80.0000 mg | Freq: Once | INTRAMUSCULAR | Status: DC

## 2017-05-06 MED ORDER — METHYLPREDNISOLONE ACETATE 80 MG/ML IJ SUSP
80.0000 mg | Freq: Once | INTRAMUSCULAR | Status: AC
  Administered 2017-05-06: 80 mg via INTRA_ARTICULAR

## 2017-05-06 NOTE — Progress Notes (Addendum)
Subjective:     Patient ID: Eric Mann, male   DOB: Jul 31, 1960, 57 y.o.   MRN: [id number]  HPI Patient seen for physical exam. He has history of hypertension, gout, obesity. Very strong family history of type 2 diabetes in mother and several siblings. He has history of benign colon polyps and last colonoscopy 2014 with recommended five-year follow-up. No history of hepatitis C screening but overall low risk. Tetanus up-to-date. No history of shingles vaccine.  Patient is nonsmoker. No consistent exercise. Recent left knee effusion which has limited activities. He has pending follow-up with sports medicine later today.  The 10-year ASCVD risk score [name], et al., 2013) is: 11.6%   Values used to calculate the score:     Age: 13 years     Sex: Male     Is Non-Hispanic African American: Yes     Diabetic: No     Tobacco smoker: No     Systolic Blood Pressure: 403 mmHg     Is BP treated: Yes     HDL Cholesterol: 60.2 mg/dL     Total Cholesterol: 256 mg/dL   Past Medical History:  Diagnosis Date  . Gout   . Gout   . Hypertension    No past surgical history on file.  reports that  has never smoked. he has never used smokeless tobacco. He reports that he does not drink alcohol or use drugs. family history includes Cancer in his sister; Diabetes in his brother, mother, and sister; Heart disease in his mother; Hypertension in his mother. No Known Allergies   Review of Systems  Constitutional: Negative for activity change, appetite change, fatigue, fever and unexpected weight change.  HENT: Negative for congestion, ear pain and trouble swallowing.   Eyes: Negative for pain and visual disturbance.  Respiratory: Negative for cough, shortness of breath and wheezing.   Cardiovascular: Negative for chest pain and palpitations.  Gastrointestinal: Negative for abdominal distention, abdominal pain, blood in stool, constipation, diarrhea, nausea, rectal pain and vomiting.  Endocrine:  Negative for polydipsia and polyuria.  Genitourinary: Negative for dysuria, hematuria and testicular pain.  Musculoskeletal: Positive for joint swelling (Left knee effusion).  Skin: Negative for rash.  Neurological: Negative for dizziness, syncope and headaches.  Hematological: Negative for adenopathy.  Psychiatric/Behavioral: Negative for confusion and dysphoric mood.       Objective:   Physical Exam  Constitutional: He is oriented to person, place, and time. He appears well-developed and well-nourished. No distress.  HENT:  Head: Normocephalic and atraumatic.  Right Ear: External ear normal.  Left Ear: External ear normal.  Mouth/Throat: Oropharynx is clear and moist.  Eyes: Conjunctivae and EOM are normal. Pupils are equal, round, and reactive to light.  Neck: Normal range of motion. Neck supple. No thyromegaly present.  Cardiovascular: Normal rate, regular rhythm and normal heart sounds.  No murmur heard. Pulmonary/Chest: No respiratory distress. He has no wheezes. He has no rales.  Abdominal: Soft. Bowel sounds are normal. He exhibits no distension and no mass. There is no tenderness. There is no rebound and no guarding.  Musculoskeletal:  Left knee effusion  Lymphadenopathy:    He has no cervical adenopathy.  Neurological: He is alert and oriented to person, place, and time. He displays normal reflexes. No cranial nerve deficit.  Skin: No rash noted.  Multiple skin tags neck area  Psychiatric: He has a normal mood and affect.       Assessment:     Complete physical. Several  issues addressed as below    Plan:     -He is strongly encouraged to lose some weight- especially with strong family history of type 2 diabetes -Set up five-year repeat colonoscopy -Check hepatitis C antibody -Obtain screening lab work -The natural history of prostate cancer and ongoing controversy regarding screening and potential treatment outcomes of prostate cancer has been discussed with the  patient. The meaning of a false positive PSA and a false negative PSA has been discussed. He indicates understanding of the limitations of this screening test and wishes to proceed with screening PSA testing. -Consider new shingles vaccine. He will check on coverage first  Eulas Post MD [facility] at [facility]

## 2017-05-06 NOTE — Assessment & Plan Note (Addendum)
Patient is here with signs and symptoms consistent with a possible meniscus tear.  Differential includes osteoarthritis.  Patient does not remember any specific event or injury which may have caused this.  His history is very suspicious of meniscus injury with new mechanical symptoms. -Knee aspiration/injection performed today.  Patient tolerated well. -Encouraged patient to purchase a compressive sleeve to help with swelling. -Encouraged regular ice, relative rest, and oral anti-inflammatories as needed/tolerated. -Patient is to follow-up in 2-3 weeks if pain and ROM does not improve.  Next: Would consider standing x-rays if pain persists but range of motion improves.  Patient may need MRI if all symptoms persist as a repetitive/persistent loss of range of motion would necessitate surgical intervention to remove meniscus tear if present.

## 2017-05-06 NOTE — Progress Notes (Signed)
HPI  CC: Left knee pain Patient is here with new onset left-sided knee pain.  He states that he has been having significant stiffness and discomfort in his left knee over the past 3 weeks.  He states that he is mostly concerned about the reduction in his range of motion as the pain is only moderately bothersome.  He endorses significant swelling.  He denies any prior issues similar to this.  No recent injury, trauma, or falls.  He states that initially he and his PCP thought that this was a gouty flare (patient has a history of gout) but after examination by his PCP this was no longer felt to be the case.  Patient denies any weakness, numbness, or paresthesias.  He endorses mechanical locking type of discomfort which prohibits him from fully extending his knee.  Associated symptoms: None  Previous Interventions Tried: Celebrex, relative rest  Past Injuries: None Past Surgeries: Noncontributory Smoking: Non-smoker Family Hx: Noncontributory  ROS: Per HPI; in addition no fever, no rash, no additional weakness, no additional numbness, no additional paresthesias, and no additional falls/injury.   Objective: BP 128/76   Ht 6\' 3"  (1.905 m)   Wt (!) 317 lb (143.8 kg)   BMI 39.62 kg/m  Gen: NAD, well groomed, a/o x3, normal affect.  CV: Well-perfused. Warm.  Resp: Non-labored.  Neuro: Sensation intact throughout. No gross coordination deficits.  Gait: Nonpathologic posture, notable antalgic limp due to the affected knee. Knee, left: TTP noted at the throughout the swollen knee without specific area of point tenderness. Inspection did not reveal erythema, ecchymosis, or bony deformity.  Obvious effusion present. Palpation yielded no asymmetric warmth; No significant joint line tenderness; No condyle tenderness; No patellar tenderness; No patellar crepitus. Patellar and quadriceps tendons unremarkable, and no tenderness of the pes anserine bursa. No obvious Baker's cyst development. ROM reduced  actively and passively in extension (10-15), full ROM in flexion (135 degrees). Normal hamstring and quadriceps strength. Neurovascularly intact bilaterally.  - Ligaments: (Solid and consistent endpoints)   - ACL (present bilaterally)   - PCL (present bilaterally)   - LCL (present bilaterally)   - MCL (present bilaterally).   - Meniscus:   - Thessaly: Unable to perform due to pain   - McMurray's: Unable to perform due to pain  JOINT ASPIRATION AND INJECTION: Left knee Patient was given informed consent, signed copy in the chart. Appropriate time out was taken. Area prepped and draped in usual sterile fashion. 3 cc lidocaine without epinephrine was used to anesthetize the soft tissue around the superior lateral aspect of the left knee. An 18-gauge needle was then inserted into the lateral suprapatellar pouch and 15 cc clear synovial fluid was extracted from the joint using a 30 cc syringe. Using a stopcock the needle was kept in place and a 5 cc syringe containing 1 cc of methylprednisolone 80 mg/ml plus 2 cc of Marcaine was injected into the suprapatellar pouch. The patient tolerated the procedure well. There were no complications. Post procedure instructions were given.    Assessment and Plan:  Knee effusion, left Patient is here with signs and symptoms consistent with a possible meniscus tear.  Differential includes osteoarthritis.  Patient does not remember any specific event or injury which may have caused this.  His history is very suspicious of meniscus injury with new mechanical symptoms. -Knee aspiration/injection performed today.  Patient tolerated well. -Encouraged patient to purchase a compressive sleeve to help with swelling. -Encouraged regular ice, relative rest, and oral  anti-inflammatories as needed/tolerated. -Patient is to follow-up in 2-3 weeks if pain and ROM does not improve.  Next: Would consider standing x-rays if pain persists but range of motion improves.  Patient may  need MRI if all symptoms persist as a repetitive/persistent loss of range of motion would necessitate surgical intervention to remove meniscus tear if present.   Meds ordered this encounter  Medications  . methylPREDNISolone acetate (DEPO-MEDROL) injection 80 mg  . methylPREDNISolone acetate (DEPO-MEDROL) injection 80 mg     Elberta Leatherwood, MD,MS [place] [facility] Fellow 05/06/2017 6:00 PM

## 2017-05-06 NOTE — Patient Instructions (Signed)
Check on coverage for new shingles vaccine (Shingrix) and let us know if interested. 

## 2017-05-07 LAB — HEPATITIS C ANTIBODY
Hepatitis C Ab: NONREACTIVE
SIGNAL TO CUT-OFF: 0.02 (ref <1.00)

## 2017-05-11 ENCOUNTER — Other Ambulatory Visit: Payer: Self-pay | Admitting: Family Medicine

## 2017-05-11 DIAGNOSIS — E785 Hyperlipidemia, unspecified: Secondary | ICD-10-CM

## 2017-05-11 MED ORDER — ATORVASTATIN CALCIUM 20 MG PO TABS: 20.0000 mg | ORAL_TABLET | Freq: Every day | ORAL | 1 refills | Status: DC

## 2017-05-27 ENCOUNTER — Ambulatory Visit
Admission: RE | Admit: 2017-05-27 | Discharge: 2017-05-27 | Disposition: A | Discharge status: Home / Self Care | Payer: 59 | Source: Ambulatory Visit | Attending: Family Medicine | Admitting: Family Medicine

## 2017-05-27 ENCOUNTER — Ambulatory Visit (INDEPENDENT_AMBULATORY_CARE_PROVIDER_SITE_OTHER): Payer: 59 | Admitting: Family Medicine

## 2017-05-27 ENCOUNTER — Encounter: Payer: Self-pay | Admitting: Family Medicine

## 2017-05-27 VITALS — BP 159/91 | Ht 75.0 in | Wt 317.0 lb

## 2017-05-27 DIAGNOSIS — M25462 Effusion, left knee: Secondary | ICD-10-CM | POA: Diagnosis not present

## 2017-05-27 NOTE — Assessment & Plan Note (Signed)
Patient is here with persistent left-sided knee pain.  He was last seen 2 weeks ago and received an aspiration injection into the affected knee.  Symptoms improved for 36-48 hours then recurred.  He has had significant improvement with Celebrex. -We will be obtaining x-rays today. -Encouraged continuation of Celebrex -Encouraged regular icing -Encouraged regular activity as long as discomfort is tolerable -I will contact him to discuss x-ray results as soon as these are obtained  Next: We may want to consider MRI if symptoms persist or worsen.

## 2017-05-27 NOTE — Progress Notes (Signed)
HPI  CC: Left knee follow-up Patient is here to follow-up regarding his left knee pain and effusion.  He was last seen 2 weeks ago for this issue.  At that time we had performed a knee aspiration and injection.  He states that that procedure had provided him about 36-48 hours of symptomatic improvement.  Unfortunately his symptoms swiftly recurred.  He states that he was provided with a Celebrex by his primary care provider.  This medication had given him some relief and he noticed a just how much relief it was providing when he ran out of it for 3 days and his pain became significantly worse until he received refills.  He denies any injury, trauma, or new events which may have worsened his pain since last being seen.  No new symptoms.  Slightly improved range of motion since the last visit.  Effusion seems to be less overall.  Medications/Interventions Tried: Aspiration/injection (steroid), Celebrex, compression.  See HPI and/or previous note for associated ROS.  Objective: BP (!) 159/91   Ht 6\' 3"  (1.905 m)   Wt (!) 317 lb (143.8 kg)   BMI 39.62 kg/m  Gen: NAD, well groomed, a/o x3, normal affect.  CV: Well-perfused. Warm.  Resp: Non-labored.  Neuro: Sensation intact throughout. No gross coordination deficits.  Gait: Nonpathologic posture, improved antalgic limp due to the affected knee. Knee, left: TTP noted at the shoulder at the medial and lateral joint lines extending posteriorly.  But she is a mild effusion surgery that is significantly improved since last visit.  No significant warmth compared to the contralateral side. Inspection did not reveal erythema, ecchymosis, or bony deformity. No condyle tenderness; No patellar tenderness; No patellar crepitus. Patellar and quadriceps tendons unremarkable, and no tenderness of the pes anserine bursa. No obvious Baker's cyst development.  Previously noted reduced ROM  is improved since last visit -- extension (0-5), flexion (135 degrees).  Normal hamstring and quadriceps strength. Neurovascularly intact bilaterally.             - Ligaments: (Solid and consistent endpoints)                         - ACL (present bilaterally)                         - PCL (present bilaterally)                         - LCL (present bilaterally)                         - MCL (present bilaterally).              - Meniscus:                         - Thessaly: Unable to perform due to pain                         - McMurray's: Unable to perform due to pain   Assessment and plan:  Knee effusion, left Patient is here with persistent left-sided knee pain.  He was last seen 2 weeks ago and received an aspiration injection into the affected knee.  Symptoms improved for 36-48 hours then recurred.  He has had significant improvement with Celebrex. -We will be obtaining x-rays  today. -Encouraged continuation of Celebrex -Encouraged regular icing -Encouraged regular activity as long as discomfort is tolerable -I will contact him to discuss x-ray results as soon as these are obtained  Next: We may want to consider MRI if symptoms persist or worsen.   Orders Placed This Encounter  Procedures  . DG Knee Complete 4 Views Left    Standing Status:   Future    Number of Occurrences:   1    Standing Expiration Date:   07/28/2018    Order Specific Question:   Reason for Exam (SYMPTOM  OR DIAGNOSIS REQUIRED)    Answer:   standing ap lat and sunrise views; left knee pain and effusion    Order Specific Question:   Preferred imaging location?    Answer:   [facility]    Order Specific Question:   Radiology Contrast Protocol - do NOT remove file path    Answer:   [email or web address]    Elberta Leatherwood, MD,MS [place] [facility] Fellow 05/27/2017 4:54 PM

## 2017-05-28 ENCOUNTER — Telehealth: Payer: Self-pay | Admitting: Family Medicine

## 2017-05-28 NOTE — Telephone Encounter (Signed)
Telephone call:  I contacted patient to discuss the x-ray results from yesterday's Left knee x-rays.  There does not seem to be any obvious reason why he should be having an acute onsets knee effusion based on the x-rays seen today.  I discussed various courses we could take from this point on.  Patient opted to move forward with obtaining an MRI.  I informed him that this would be ordered and my staff will contact him tomorrow to set up an appointment to have this done.  Plan: -Obtain MRI of the Left knee. -I will contact patient via phone once MRI results have been obtained to discuss the next course of action (surgery, PT, etc)   Elberta Leatherwood, MD,MS [place] Fellow 05/28/2017 6:03 PM

## 2017-05-29 ENCOUNTER — Other Ambulatory Visit: Payer: Self-pay

## 2017-05-29 DIAGNOSIS — M25562 Pain in left knee: Secondary | ICD-10-CM

## 2017-05-29 NOTE — Telephone Encounter (Signed)
Order placed for left knee MRI w/o contrast. Called pt to make him aware. Pt will call [facility] to schedule appt.

## 2017-05-29 NOTE — Progress Notes (Signed)
Order placed for left knee MRI w/o contrast. Called pt to make him aware. Pt will call [facility] to schedule appt.

## 2017-06-01 ENCOUNTER — Other Ambulatory Visit: Payer: Self-pay

## 2017-06-03 ENCOUNTER — Ambulatory Visit
Admission: RE | Admit: 2017-06-03 | Discharge: 2017-06-03 | Disposition: A | Discharge status: Home / Self Care | Payer: 59 | Source: Ambulatory Visit | Attending: Family Medicine | Admitting: Family Medicine

## 2017-06-03 DIAGNOSIS — M25562 Pain in left knee: Secondary | ICD-10-CM

## 2017-06-09 ENCOUNTER — Telehealth: Payer: Self-pay | Admitting: Family Medicine

## 2017-06-09 NOTE — Telephone Encounter (Signed)
Telephone Call:  Contacted patient with MRI results (see results in chart). We spent some time discussed possible treatment options. Patient would like to move forward with referral to Ortho to get opinion on possible surgical intervention.   Patient has no preference on surgeon at this time. I mentioned referral to [facility] as I have the most experience with these surgeons >> patient was ok with referral being sent to their practice.   I have asked my staff to send referral to [facility] due to an acute meniscus tear of the Left Knee.  Elberta Leatherwood, MD,MS [place] [facility] Fellow 06/09/2017 2:38 PM

## 2017-06-09 NOTE — Telephone Encounter (Signed)
-----   Message from Carolyne Littles sent at 06/09/2017 11:48 AM EDT ----- Regarding: phone message Contact: [phone] Pt is looking for his MRI results.

## 2017-06-09 NOTE — Telephone Encounter (Signed)
Eric Mann and [facility] [address] Noemi Chapel Monday 06/15/17 @ 4p [place] [id number] [phone]

## 2017-06-23 ENCOUNTER — Ambulatory Visit (AMBULATORY_SURGERY_CENTER): Payer: Self-pay | Admitting: *Deleted

## 2017-06-23 ENCOUNTER — Other Ambulatory Visit: Payer: Self-pay

## 2017-06-23 VITALS — Ht 75.5 in | Wt 316.8 lb

## 2017-06-23 DIAGNOSIS — Z86010 Personal history of colonic polyps: Secondary | ICD-10-CM

## 2017-06-23 MED ORDER — NA SULFATE-K SULFATE-MG SULF 17.5-3.13-1.6 GM/177ML PO SOLN: 1.0000 [IU] | Freq: Once | ORAL | 0 refills | Status: AC

## 2017-06-23 NOTE — Progress Notes (Signed)
No egg or soy allergy known to patient  No issues with past sedation with any surgeries  or procedures, no intubation problems  No diet pills per patient No home 02 use per patient  No blood thinners per patient  Pt denies issues with constipation  No A fib or A flutter  EMMI video sent to pt's e mail  

## 2017-06-24 HISTORY — PX: KNEE ARTHROSCOPY: SUR90

## 2017-06-26 ENCOUNTER — Encounter: Payer: Self-pay | Admitting: Internal Medicine

## 2017-07-07 ENCOUNTER — Other Ambulatory Visit: Payer: Self-pay

## 2017-07-07 ENCOUNTER — Encounter: Payer: Self-pay | Admitting: Internal Medicine

## 2017-07-07 ENCOUNTER — Ambulatory Visit (AMBULATORY_SURGERY_CENTER): Payer: 59 | Admitting: Internal Medicine

## 2017-07-07 VITALS — BP 136/75 | HR 52 | Temp 98.7°F | Resp 13 | Ht 75.5 in | Wt 316.8 lb

## 2017-07-07 DIAGNOSIS — D124 Benign neoplasm of descending colon: Secondary | ICD-10-CM

## 2017-07-07 DIAGNOSIS — Z86010 Personal history of colonic polyps: Secondary | ICD-10-CM

## 2017-07-07 DIAGNOSIS — D122 Benign neoplasm of ascending colon: Secondary | ICD-10-CM | POA: Diagnosis not present

## 2017-07-07 DIAGNOSIS — D120 Benign neoplasm of cecum: Secondary | ICD-10-CM

## 2017-07-07 DIAGNOSIS — D123 Benign neoplasm of transverse colon: Secondary | ICD-10-CM | POA: Diagnosis not present

## 2017-07-07 MED ORDER — SODIUM CHLORIDE 0.9 % IV SOLN: 500.0000 mL | Freq: Once | INTRAVENOUS | Status: DC

## 2017-07-07 NOTE — Op Note (Signed)
[facility] Patient Name: Eric Mann Procedure Date: 07/07/2017 9:06 AM MRN: [id number] Endoscopist: Docia Chuck. Henrene Pastor , MD Age: 57 Referring MD:  Date of Birth: 07-06-1960 Gender: Male Account #: [id number] Procedure:                Colonoscopy, with cold snare polypectomy x 4 Indications:              High risk colon cancer surveillance: Personal                            history of non-advanced adenoma. Index examination                            April 2014 Medicines:                Monitored Anesthesia Care Procedure:                Pre-Anesthesia Assessment:                           - Prior to the procedure, a History and Physical                            was performed, and patient medications and                            allergies were reviewed. The patient's tolerance of                            previous anesthesia was also reviewed. The risks                            and benefits of the procedure and the sedation                            options and risks were discussed with the patient.                            All questions were answered, and informed consent                            was obtained. Prior Anticoagulants: The patient has                            taken no previous anticoagulant or antiplatelet                            agents. ASA Grade Assessment: II - A patient with                            mild systemic disease. After reviewing the risks                            and benefits, the patient was deemed in  satisfactory condition to undergo the procedure.                           After obtaining informed consent, the colonoscope                            was passed under direct vision. Throughout the                            procedure, the patient's blood pressure, pulse, and                            oxygen saturations were monitored continuously. The                            Colonoscope was  introduced through the anus and                            advanced to the the cecum, identified by                            appendiceal orifice and ileocecal valve. The                            ileocecal valve, appendiceal orifice, and rectum                            were photographed. The quality of the bowel                            preparation was excellent. The colonoscopy was                            performed without difficulty. The patient tolerated                            the procedure well. The bowel preparation used was                            SUPREP. Scope In: 9:16:08 AM Scope Out: 9:37:19 AM Scope Withdrawal Time: 0 hours 18 minutes 48 seconds  Total Procedure Duration: 0 hours 21 minutes 11 seconds  Findings:                 Four polyps were found in the descending colon,                            transverse colon, ascending colon and cecum. The                            polyps were 1 to 6 mm in size. These polyps were                            removed with a cold snare. Resection and retrieval  were complete.                           Multiple diverticula were found in the left colon                            and right colon.                           Internal hemorrhoids were found during retroflexion.                           The exam was otherwise without abnormality on                            direct and retroflexion views. Complications:            No immediate complications. Estimated blood loss:                            None. Estimated Blood Loss:     Estimated blood loss: none. Impression:               - Four 1 to 6 mm polyps in the descending colon, in                            the transverse colon, in the ascending colon and in                            the cecum, removed with a cold snare. Resected and                            retrieved.                           - Diverticulosis in the left colon and in  the right                            colon.                           - Internal hemorrhoids.                           - The examination was otherwise normal on direct                            and retroflexion views. Recommendation:           - Repeat colonoscopy in 3 years for surveillance if                            3 or more adenomas. Otherwise 5 years.                           - Patient has a contact number available for  emergencies. The signs and symptoms of potential                            delayed complications were discussed with the                            patient. Return to normal activities tomorrow.                            Written discharge instructions were provided to the                            patient.                           - Resume previous diet.                           - Continue present medications.                           - Await pathology results. Docia Chuck. Henrene Pastor, MD 07/07/2017 9:43:58 AM This report has been signed electronically.

## 2017-07-07 NOTE — Progress Notes (Signed)
Report to PACU, RN, vss, BBS= Clear.  

## 2017-07-07 NOTE — Patient Instructions (Signed)
YOU HAD AN ENDOSCOPIC PROCEDURE TODAY AT THE [facility]:   Refer to the procedure report that was given to you for any specific questions about what was found during the examination.  If the procedure report does not answer your questions, please call your gastroenterologist to clarify.  If you requested that your care partner not be given the details of your procedure findings, then the procedure report has been included in a sealed envelope for you to review at your convenience later.  YOU SHOULD EXPECT: Some feelings of bloating in the abdomen. Passage of more gas than usual.  Walking can help get rid of the air that was put into your GI tract during the procedure and reduce the bloating. If you had a lower endoscopy (such as a colonoscopy or flexible sigmoidoscopy) you may notice spotting of blood in your stool or on the toilet paper. If you underwent a bowel prep for your procedure, you may not have a normal bowel movement for a few days.  Please Note:  You might notice some irritation and congestion in your nose or some drainage.  This is from the oxygen used during your procedure.  There is no need for concern and it should clear up in a day or so.  SYMPTOMS TO REPORT IMMEDIATELY:   Following lower endoscopy (colonoscopy or flexible sigmoidoscopy):  Excessive amounts of blood in the stool  Significant tenderness or worsening of abdominal pains  Swelling of the abdomen that is new, acute  Fever of 100F or higher  Please see handouts given to you on Polyps, Hemorrhoids and Diverticulosis.  For urgent or emergent issues, a gastroenterologist can be reached at any hour by calling [phone].   DIET:  We do recommend a small meal at first, but then you may proceed to your regular diet.  Drink plenty of fluids but you should avoid alcoholic beverages for 24 hours.  ACTIVITY:  You should plan to take it easy for the rest of today and you should NOT DRIVE or use heavy  machinery until tomorrow (because of the sedation medicines used during the test).    FOLLOW UP: Our staff will call the number listed on your records the next business day following your procedure to check on you and address any questions or concerns that you may have regarding the information given to you following your procedure. If we do not reach you, we will leave a message.  However, if you are feeling well and you are not experiencing any problems, there is no need to return our call.  We will assume that you have returned to your regular daily activities without incident.  If any biopsies were taken you will be contacted by phone or by letter within the next 1-3 weeks.  Please call us at [phone] if you have not heard about the biopsies in 3 weeks.    SIGNATURES/CONFIDENTIALITY: You and/or your care partner have signed paperwork which will be entered into your electronic medical record.  These signatures attest to the fact that that the information above on your After Visit Summary has been reviewed and is understood.  Full responsibility of the confidentiality of this discharge information lies with you and/or your care-partner.  Thank you for letting us take care of your healthcare needs today. 

## 2017-07-07 NOTE — Progress Notes (Signed)
Pt's states no medical or surgical changes since previsit or office visit. 

## 2017-07-07 NOTE — Progress Notes (Signed)
Called to room to assist during endoscopic procedure.  Patient ID and intended procedure confirmed with present staff. Received instructions for my participation in the procedure from the performing physician.  

## 2017-07-08 ENCOUNTER — Telehealth: Payer: Self-pay | Admitting: *Deleted

## 2017-07-08 ENCOUNTER — Telehealth: Payer: Self-pay

## 2017-07-08 NOTE — Telephone Encounter (Signed)
  Follow up Call-  Call back number 07/07/2017  Post procedure Call Back phone  # [phone]  Permission to leave phone message Yes  Some recent data might be hidden     Patient questions:  Do you have a fever, pain , or abdominal swelling? Yes.   Pain Score  0 *  Have you tolerated food without any problems? Yes.    Have you been able to return to your normal activities? Yes  Do you have any questions about your discharge instructions: Diet   No. Medications  No. Follow up visit  No.  Do you have questions or concerns about your Care? No.  Actions: * If pain score is 4 or above: No action needed, pain <4.

## 2017-07-08 NOTE — Telephone Encounter (Signed)
No answer, unable to leave message on phone

## 2017-07-09 ENCOUNTER — Encounter: Payer: Self-pay | Admitting: Internal Medicine

## 2017-08-03 ENCOUNTER — Other Ambulatory Visit (INDEPENDENT_AMBULATORY_CARE_PROVIDER_SITE_OTHER): Payer: 59

## 2017-08-03 ENCOUNTER — Other Ambulatory Visit: Payer: Self-pay | Admitting: *Deleted

## 2017-08-03 DIAGNOSIS — E785 Hyperlipidemia, unspecified: Secondary | ICD-10-CM | POA: Diagnosis not present

## 2017-08-03 LAB — LIPID PANEL
CHOL/HDL RATIO: 4
Cholesterol: 211 mg/dL — ABNORMAL HIGH (ref 0–200)
HDL: 49.1 mg/dL (ref >39.00)
LDL CALC: 143 mg/dL — AB (ref 0–99)
NonHDL: 161.88
Triglycerides: 93 mg/dL (ref 0.0–149.0)
VLDL: 18.6 mg/dL (ref 0.0–40.0)

## 2017-08-03 LAB — HEPATIC FUNCTION PANEL
ALT: 20 U/L (ref 0–53)
AST: 14 U/L (ref 0–37)
Albumin: 3.9 g/dL (ref 3.5–5.2)
Alkaline Phosphatase: 73 U/L (ref 39–117)
BILIRUBIN DIRECT: 0.1 mg/dL (ref 0.0–0.3)
BILIRUBIN TOTAL: 0.4 mg/dL (ref 0.2–1.2)
Total Protein: 6.5 g/dL (ref 6.0–8.3)

## 2017-08-03 MED ORDER — ATORVASTATIN CALCIUM 40 MG PO TABS: 40.0000 mg | ORAL_TABLET | Freq: Every day | ORAL | 3 refills | Status: DC

## 2017-11-24 ENCOUNTER — Encounter: Payer: Self-pay | Admitting: *Deleted

## 2017-11-24 ENCOUNTER — Other Ambulatory Visit: Payer: Self-pay | Admitting: Family Medicine

## 2017-11-24 ENCOUNTER — Ambulatory Visit (INDEPENDENT_AMBULATORY_CARE_PROVIDER_SITE_OTHER): Payer: 59 | Admitting: Family Medicine

## 2017-11-24 ENCOUNTER — Encounter: Payer: Self-pay | Admitting: Family Medicine

## 2017-11-24 VITALS — BP 132/84 | HR 69 | Temp 97.9°F | Resp 16 | Ht 75.5 in | Wt 319.5 lb

## 2017-11-24 DIAGNOSIS — I10 Essential (primary) hypertension: Secondary | ICD-10-CM

## 2017-11-24 DIAGNOSIS — M545 Low back pain, unspecified: Secondary | ICD-10-CM

## 2017-11-24 MED ORDER — CYCLOBENZAPRINE HCL 10 MG PO TABS
10.0000 mg | ORAL_TABLET | Freq: Every day | ORAL | 0 refills | Status: AC
Start: 2017-11-24 — End: 2017-12-09

## 2017-11-24 MED ORDER — KETOROLAC TROMETHAMINE 60 MG/2ML IM SOLN
60.0000 mg | Freq: Once | INTRAMUSCULAR | Status: AC
  Administered 2017-11-24: 60 mg via INTRAMUSCULAR

## 2017-11-24 MED ORDER — CELECOXIB 200 MG PO CAPS: ORAL_CAPSULE | ORAL | 0 refills | Status: AC

## 2017-11-24 NOTE — Patient Instructions (Addendum)
  Mr.Derril V Grosz I have seen you today for an acute visit.  A few things to remember from today's visit:   Acute right-sided low back pain, unspecified whether sciatica present - Plan: ketorolac (TORADOL) injection 60 mg, celecoxib (CELEBREX) 200 MG capsule, cyclobenzaprine (FLEXERIL) 10 MG tablet  Essential hypertension, benign  Tomorrow you can start with Celebrex 1 capsule daily. Flexeril at bedtime may also help with back pain. Gentle range of motion exercises. Local IcyHot or Aspercreme with lidocaine may also help.  Because you are taking the Celebrex, monitor your blood pressure regularly.   In general please monitor for signs of worsening symptoms and seek immediate medical attention if any concerning.  If symptoms are not resolved in 1-2 weeks you should schedule a follow up appointment with your doctor, before if needed.  I hope you get better soon!

## 2017-11-24 NOTE — Progress Notes (Signed)
ACUTE VISIT   HPI:  Chief Complaint  Patient presents with  . Hip Pain    right hip pain that started a few days ago, worse today, hurts to sit     Eric Mann is a 57 y.o. male, who is here today complaining of right lower back and hip pain for a couple days but worse today. Right lower back pain radiated to buttock and posterior aspect of thigh. He denies numbness or tingling. No saddle anesthesia or changes in urine/bowel continence.  Dull pain,initially intermittent but today it has been constant.  He has not taken analgesic meds today.  Hx of gout. History of knee OA and foot equinus deformity. He follows with sports medicine, Dr. Alease Frame.  He has taken Celebrex 200 mg in the past. Currently he is on allopurinol 300 mg daily and colchicine 0.6 mg daily as needed.  Hx of HTN, he is on Amlodipine 5 mg. He does not check BP at home. Negative for headache,chest pain,dyspnea, or edema.   Review of Systems  Constitutional: Negative for appetite change, chills, fatigue and fever.  Respiratory: Negative for shortness of breath and wheezing.   Cardiovascular: Negative for leg swelling.  Gastrointestinal: Negative for abdominal pain, nausea and vomiting.       No changes in bowel habits.  Genitourinary: Negative for decreased urine volume, difficulty urinating, dysuria, hematuria and urgency.  Musculoskeletal: Positive for back pain. Negative for neck pain.  Skin: Negative for color change and rash.  Neurological: Negative for syncope, weakness, numbness and headaches.      Current Outpatient Medications on File Prior to Visit  Medication Sig Dispense Refill  . allopurinol (ZYLOPRIM) 300 MG tablet Take 1 tablet (300 mg total) daily by mouth. 90 tablet 3  . amLODipine (NORVASC) 5 MG tablet Take 1 tablet (5 mg total) by mouth daily. 90 tablet 3  . atorvastatin (LIPITOR) 40 MG tablet Take 1 tablet (40 mg total) by mouth daily. 90 tablet 3  . colchicine  (COLCRYS) 0.6 MG tablet Take 1 tablet (0.6 mg total) daily by mouth. (Patient not taking: Reported on 11/24/2017) 60 tablet 2  . sildenafil (VIAGRA) 100 MG tablet Take 1 tablet (100 mg total) by mouth daily as needed for erectile dysfunction. (Patient not taking: Reported on 11/24/2017) 6 tablet 11   Current Facility-Administered Medications on File Prior to Visit  Medication Dose Route Frequency Provider Last Rate Last Dose  . 0.9 %  sodium chloride infusion  500 mL Intravenous Once Irene Shipper, MD      . methylPREDNISolone acetate (DEPO-MEDROL) injection 80 mg  80 mg Intramuscular Once McKeag, Marylynn Pearson, MD         Past Medical History:  Diagnosis Date  . Arthritis    lt. knee  . Glaucoma    not on medicine yet  . Gout   . Gout   . Hyperlipidemia   . Hypertension    No Known Allergies  Social History   Socioeconomic History  . Marital status: Married    Spouse name: Not on file  . Number of children: Not on file  . Years of education: Not on file  . Highest education level: Not on file  Occupational History  . Not on file  Social Needs  . Financial resource strain: Not on file  . Food insecurity:    Worry: Not on file    Inability: Not on file  . Transportation needs:    Medical:  Not on file    Non-medical: Not on file  Tobacco Use  . Smoking status: Never Smoker  . Smokeless tobacco: Never Used  Substance and Sexual Activity  . Alcohol use: No    Alcohol/week: 0.0 standard drinks    Comment: wine occasionally  . Drug use: No  . Sexual activity: Not on file  Lifestyle  . Physical activity:    Days per week: Not on file    Minutes per session: Not on file  . Stress: Not on file  Relationships  . Social connections:    Talks on phone: Not on file    Gets together: Not on file    Attends religious service: Not on file    Active member of club or organization: Not on file    Attends meetings of clubs or organizations: Not on file    Relationship status: Not on  file  Other Topics Concern  . Not on file  Social History Narrative  . Not on file    Vitals:   11/24/17 1436  BP: 132/84  Pulse: 69  Resp: 16  Temp: 97.9 F (36.6 C)  SpO2: 100%   Body mass index is 39.41 kg/m.   Physical Exam  Nursing note and vitals reviewed. Constitutional: He is oriented to person, place, and time. He appears well-developed. He appears distressed.  HENT:  Head: Normocephalic and atraumatic.  Mouth/Throat: Oropharynx is clear and moist and mucous membranes are normal.  Eyes: Conjunctivae and EOM are normal.  Cardiovascular: Normal rate and regular rhythm.  No murmur heard. Respiratory: Effort normal and breath sounds normal. No respiratory distress.  GI: Soft. He exhibits no mass. There is no tenderness.  Musculoskeletal: He exhibits no edema.       Right hip: He exhibits normal range of motion, normal strength, no tenderness and no deformity.       Back:  No significant deformity appreciated. Mild tenderness upon palpation of right-sided lower back muscles. Pain elicited with movement on exam table during examination.  Also when flexing right hip when standing up. No local edema or erythema appreciated, no suspicious lesions.   Neurological: He is alert and oriented to person, place, and time. He has normal strength. Gait abnormal.  Skin: Skin is warm. No rash noted. No erythema.  Psychiatric: He has a normal mood and affect.  Well groomed,good eye contact.      ASSESSMENT AND PLAN:   Mr. Eric Mann was seen today for hip pain.  Diagnoses and all orders for this visit:  Acute right-sided low back pain, unspecified whether sciatica present  New problem. Because no Hx of trauma I do not think imaging is needed today. After verbal consent and discussion of some side effects he agrees with Toradol 60 mg IM. Educated about side effects of NSAID's and muscle relaxants.  -     ketorolac (TORADOL) injection 60 mg -     celecoxib (CELEBREX) 200  MG capsule; TAKE 1 CAPSULE BY MOUTH EVERY DAY -     cyclobenzaprine (FLEXERIL) 10 MG tablet; Take 1 tablet (10 mg total) by mouth at bedtime for 15 days.  Essential hypertension, benign  Ideal goal < 130/80. Recommended monitoring BP periodically. No changes in current management. Continue following with PCP.   Return if symptoms worsen or fail to improve.     Willys Salvino G. Martinique, MD  [facility]. [place] office.

## 2017-11-28 ENCOUNTER — Encounter: Payer: Self-pay | Admitting: Family Medicine

## 2017-12-16 ENCOUNTER — Other Ambulatory Visit: Payer: Self-pay

## 2017-12-16 ENCOUNTER — Ambulatory Visit (INDEPENDENT_AMBULATORY_CARE_PROVIDER_SITE_OTHER): Payer: 59

## 2017-12-16 ENCOUNTER — Ambulatory Visit (INDEPENDENT_AMBULATORY_CARE_PROVIDER_SITE_OTHER): Payer: 59 | Admitting: Family Medicine

## 2017-12-16 ENCOUNTER — Encounter: Payer: Self-pay | Admitting: Family Medicine

## 2017-12-16 VITALS — BP 142/88 | HR 59 | Temp 98.1°F | Ht 75.0 in | Wt 321.1 lb

## 2017-12-16 DIAGNOSIS — M25551 Pain in right hip: Secondary | ICD-10-CM | POA: Diagnosis not present

## 2017-12-16 DIAGNOSIS — Z23 Encounter for immunization: Secondary | ICD-10-CM

## 2017-12-16 NOTE — Progress Notes (Signed)
  Subjective:     Patient ID: Eric Mann, male   DOB: 03-13-1960, 57 y.o.   MRN: [id number]  HPI Patient is seen with right hip pain.  He was seen here a few weeks ago for the same (by Dr Martinique) and received Toradol along with prescription for Flexeril and Celebrex and has seen no improvement.  Pain started about 3 weeks ago.  Location is mostly anterior hip.  He thinks this may be work-related but denies specific injury.  Job requires frequent bending and lifting.  Pain is a dull ache and is worse with movement such as ambulation.  He still has fairly good range of motion of the hip.  Denies prior hip difficulties.  No lower extremity edema.  No back pain.  No radiation of pain.  No alleviating factors.  Past Medical History:  Diagnosis Date  . Arthritis    lt. knee  . Glaucoma    not on medicine yet  . Gout   . Gout   . Hyperlipidemia   . Hypertension    Past Surgical History:  Procedure Laterality Date  . COLONOSCOPY    . KNEE ARTHROSCOPY Left 06/24/2017  . POLYPECTOMY      reports that he has never smoked. He has never used smokeless tobacco. He reports that he does not drink alcohol or use drugs. family history includes Cancer in his sister; Diabetes in his brother, mother, and sister; Heart disease in his mother; Hypertension in his mother. No Known Allergies   Review of Systems  Neurological: Negative for weakness and numbness.       Objective:   Physical Exam  Constitutional: He appears well-developed and well-nourished.  Cardiovascular: Normal rate and regular rhythm.  Pulmonary/Chest: Effort normal and breath sounds normal.  Musculoskeletal:  Legs reveal no edema.  He has excellent range of motion right hip.  No lateral or anterior tenderness.  He has no pain with flexion against resistance.       Assessment:     3-week history of persistent right anterior hip pain.  Question muscular/tendon strain    Plan:     -Recommend start with some plain x-rays.   If unrevealing consider sports medicine referral for further evaluation -x-ray shows some moderate bilateral degenerative changes.  No acute changes.  Set up referral.  Eulas Post MD [facility]

## 2017-12-16 NOTE — Patient Instructions (Signed)
We will set up sports medicine referral.  

## 2017-12-23 ENCOUNTER — Encounter: Payer: Self-pay | Admitting: Family Medicine

## 2017-12-23 ENCOUNTER — Ambulatory Visit (INDEPENDENT_AMBULATORY_CARE_PROVIDER_SITE_OTHER): Payer: 59 | Admitting: Family Medicine

## 2017-12-23 VITALS — BP 142/92 | Ht 75.5 in | Wt 319.0 lb

## 2017-12-23 DIAGNOSIS — M25551 Pain in right hip: Secondary | ICD-10-CM

## 2017-12-23 MED ORDER — NAPROXEN 500 MG PO TABS: 500.0000 mg | ORAL_TABLET | Freq: Two times a day (BID) | ORAL | 1 refills | Status: DC | PRN

## 2017-12-23 NOTE — Patient Instructions (Signed)
Your exam is reassuring. This is consistent with a hip flexor strain though you have underlying mild arthritis (your exam doesn't indicate the arthritis is bothering you right now). Naproxen twice a day with food for pain and inflammation. Do home exercises and stretches every day. Consider physical therapy if you're struggling. Follow up with me in 1 month.

## 2017-12-23 NOTE — Progress Notes (Signed)
PCP: Eulas Post, MD  Subjective:   HPI: Patient is a 57 y.o. male here for right hip pain.  Patient reports about 3 weeks ago he recalls turning to the right side and feeling a sharp pain right groin area. No swelling or bruising. Has had intermittent pain in this area since, worse with turning and at times will feel like right leg is going to give out. Has tried ibuprofen and stretching - stretching seems to help. Getting into car bothers him. No numbness into leg, radiation. No back pain. No bowel/bladder dysfunction.  Past Medical History:  Diagnosis Date  . Arthritis    lt. knee  . Glaucoma    not on medicine yet  . Gout   . Gout   . Hyperlipidemia   . Hypertension     Current Outpatient Medications on File Prior to Visit  Medication Sig Dispense Refill  . allopurinol (ZYLOPRIM) 300 MG tablet Take 1 tablet (300 mg total) daily by mouth. 90 tablet 3  . amLODipine (NORVASC) 5 MG tablet Take 1 tablet (5 mg total) by mouth daily. 90 tablet 3  . atorvastatin (LIPITOR) 40 MG tablet Take 1 tablet (40 mg total) by mouth daily. 90 tablet 3  . colchicine (COLCRYS) 0.6 MG tablet Take 1 tablet (0.6 mg total) daily by mouth. 60 tablet 2  . sildenafil (VIAGRA) 100 MG tablet Take 1 tablet (100 mg total) by mouth daily as needed for erectile dysfunction. 6 tablet 11   Current Facility-Administered Medications on File Prior to Visit  Medication Dose Route Frequency Provider Last Rate Last Dose  . 0.9 %  sodium chloride infusion  500 mL Intravenous Once Irene Shipper, MD      . methylPREDNISolone acetate (DEPO-MEDROL) injection 80 mg  80 mg Intramuscular Once McKeag, Marylynn Pearson, MD        Past Surgical History:  Procedure Laterality Date  . COLONOSCOPY    . KNEE ARTHROSCOPY Left 06/24/2017  . POLYPECTOMY      No Known Allergies  Social History   Socioeconomic History  . Marital status: Married    Spouse name: Not on file  . Number of children: Not on file  . Years of  education: Not on file  . Highest education level: Not on file  Occupational History  . Not on file  Social Needs  . Financial resource strain: Not on file  . Food insecurity:    Worry: Not on file    Inability: Not on file  . Transportation needs:    Medical: Not on file    Non-medical: Not on file  Tobacco Use  . Smoking status: Never Smoker  . Smokeless tobacco: Never Used  Substance and Sexual Activity  . Alcohol use: No    Alcohol/week: 0.0 standard drinks    Comment: wine occasionally  . Drug use: No  . Sexual activity: Not on file  Lifestyle  . Physical activity:    Days per week: Not on file    Minutes per session: Not on file  . Stress: Not on file  Relationships  . Social connections:    Talks on phone: Not on file    Gets together: Not on file    Attends religious service: Not on file    Active member of club or organization: Not on file    Attends meetings of clubs or organizations: Not on file    Relationship status: Not on file  . Intimate partner violence:  Fear of current or ex partner: Not on file    Emotionally abused: Not on file    Physically abused: Not on file    Forced sexual activity: Not on file  Other Topics Concern  . Not on file  Social History Narrative  . Not on file    Family History  Problem Relation Age of Onset  . Heart disease Mother   . Hypertension Mother   . Diabetes Mother   . Diabetes Sister   . Cancer Sister        cervix cancer  . Diabetes Brother   . Colon cancer Neg Hx   . Esophageal cancer Neg Hx   . Rectal cancer Neg Hx   . Stomach cancer Neg Hx   . Colon polyps Neg Hx     BP (!) 142/92   Ht 6' 3.5" (1.918 m)   Wt (!) 319 lb (144.7 kg)   BMI 39.35 kg/m   Review of Systems: See HPI above.     Objective:  Physical Exam:  Gen: NAD, comfortable in exam room  Back: No gross deformity, scoliosis. No TTP .  No midline or bony TTP. FROM without pain. Strength LEs 5/5 all muscle groups.   Trace MSRs  in patellar and achilles tendons, equal bilaterally. Negative SLRs. Sensation intact to light touch bilaterally.  Right hip: No deformity. FROM with 5/5 strength all motions. No tenderness to palpation. NVI distally. Negative logroll Negative fabers and piriformis stretches.  Mild pain in right groin with fabers. Negative fadir.   Assessment & Plan:  1. Right hip pain - independently reviewed radiographs and no acute findings; noted mild arthritis but no pain with passive motions of hip to suggest this is cause of his pain.  Consistent with snapping hip mechanism onto iliopsoas causing hip flexor strain.  He opted for home exercise program.  Naproxen twice a day.  Heat or ice.  Consider compression shorts.  F/u in 1 month.  Consider physical therapy if not improving.

## 2018-01-13 ENCOUNTER — Ambulatory Visit: Payer: 59 | Admitting: Family Medicine

## 2018-01-20 ENCOUNTER — Ambulatory Visit: Payer: 59 | Admitting: Family Medicine

## 2018-02-16 ENCOUNTER — Other Ambulatory Visit: Payer: Self-pay | Admitting: Family Medicine

## 2018-03-30 ENCOUNTER — Telehealth: Payer: Self-pay | Admitting: Family Medicine

## 2018-03-30 ENCOUNTER — Other Ambulatory Visit: Payer: Self-pay

## 2018-03-30 MED ORDER — AMLODIPINE BESYLATE 5 MG PO TABS: 5.0000 mg | ORAL_TABLET | Freq: Every day | ORAL | 0 refills | Status: DC

## 2018-03-30 MED ORDER — ALLOPURINOL 300 MG PO TABS: 300.0000 mg | ORAL_TABLET | Freq: Every day | ORAL | 0 refills | Status: DC

## 2018-03-30 NOTE — Telephone Encounter (Signed)
Both of these prescriptions have been sent to the pharmacy.

## 2018-03-30 NOTE — Telephone Encounter (Unsigned)
Copied from [place] [id number]. Topic: Quick Communication - Rx Refill/Question >> Mar 30, 2018 11:14 AM Carolyn Stare wrote: Eric Mann has a scheduled for March and req refills on the below meds    Medication  allopurinol (ZYLOPRIM) 300 MG tablet pt did not know this med was refilled in Dec 2019 so he never picked it up never     amLODipine (NORVASC) 5 MG tablet     [place]    Preferred Pharmacy (with phone number or street name): ***  Agent: Please be advised that RX refills may take up to 3 business days. We ask that you follow-up with your pharmacy.

## 2018-05-11 ENCOUNTER — Other Ambulatory Visit: Payer: Self-pay

## 2018-05-11 ENCOUNTER — Telehealth: Payer: Self-pay | Admitting: Family Medicine

## 2018-05-11 MED ORDER — NAPROXEN 500 MG PO TABS: 500.0000 mg | ORAL_TABLET | Freq: Two times a day (BID) | ORAL | 0 refills | Status: DC

## 2018-05-11 NOTE — Telephone Encounter (Signed)
Could send in one refill of Naproxen 500 mg po bid #60.

## 2018-05-11 NOTE — Telephone Encounter (Signed)
Called patient and let him know that I have sent in his prescription per Dr. Elease Hashimoto. Patient verbalized an understanding. Also let patient know that he can schedule a WebEx appointment if he has persistent or worse symptoms. Patient verbalized an understanding.

## 2018-05-11 NOTE — Telephone Encounter (Signed)
Please see message. °

## 2018-05-11 NOTE — Telephone Encounter (Signed)
Pt is ok with cancelling CPE until a later date but is currently experiencing pain in his right hip and was referred to a sports med Dr and was prescribed Naproxen and wants to know if Dr. Elease Hashimoto can call in something for the hip pain./ please advise

## 2018-05-11 NOTE — Telephone Encounter (Signed)
LMVM for the patient to reschedule his physical appointment 3 months out

## 2018-05-12 ENCOUNTER — Encounter: Payer: 59 | Admitting: Family Medicine

## 2018-08-02 ENCOUNTER — Encounter: Payer: 59 | Admitting: Family Medicine

## 2018-08-02 ENCOUNTER — Telehealth: Payer: Self-pay | Admitting: Family Medicine

## 2018-08-02 NOTE — Telephone Encounter (Signed)
Patient is requesting pain medication to be refill since Dr Elease Hashimoto was out of the office so patient had to reschedule visit so couldn't get his medication refilled. Pt is requesting a phone call back related to this matter.  He only has 3 pills left.

## 2018-08-03 ENCOUNTER — Other Ambulatory Visit: Payer: Self-pay

## 2018-08-03 MED ORDER — AMLODIPINE BESYLATE 5 MG PO TABS: 5.0000 mg | ORAL_TABLET | Freq: Every day | ORAL | 0 refills | Status: DC

## 2018-08-03 MED ORDER — ALLOPURINOL 300 MG PO TABS: 300.0000 mg | ORAL_TABLET | Freq: Every day | ORAL | 0 refills | Status: DC

## 2018-08-03 MED ORDER — NAPROXEN 500 MG PO TABS: 500.0000 mg | ORAL_TABLET | Freq: Two times a day (BID) | ORAL | 0 refills | Status: AC

## 2018-08-03 MED ORDER — COLCHICINE 0.6 MG PO TABS: 0.6000 mg | ORAL_TABLET | Freq: Every day | ORAL | 1 refills | Status: AC

## 2018-08-03 MED ORDER — ATORVASTATIN CALCIUM 40 MG PO TABS: 40.0000 mg | ORAL_TABLET | Freq: Every day | ORAL | 0 refills | Status: DC

## 2018-08-03 NOTE — Telephone Encounter (Signed)
I am confused by these messages.  Note by Marcene Corning mentions "pain medication" and then reference in second note mentions Sildenafil.   OK to refill Sildenafil.   Is there anything else he needs refills of??

## 2018-08-03 NOTE — Telephone Encounter (Signed)
I called patient and he needed refills so I have sent to [facility] for patient and the only one I have not sent is his sildenafil. His physical was rescheduled for the first opening in August 2020

## 2018-08-09 NOTE — Telephone Encounter (Signed)
Patient called Sildenafil "pain medication"-- only one prescription needed.

## 2018-08-10 ENCOUNTER — Other Ambulatory Visit: Payer: Self-pay

## 2018-08-10 MED ORDER — SILDENAFIL CITRATE 100 MG PO TABS: 100.0000 mg | ORAL_TABLET | Freq: Every day | ORAL | 11 refills | Status: DC | PRN

## 2018-08-10 NOTE — Telephone Encounter (Signed)
Sildenafil has been sent to [facility] for the patient.

## 2018-10-08 ENCOUNTER — Other Ambulatory Visit: Payer: Self-pay

## 2018-10-08 ENCOUNTER — Encounter: Payer: Self-pay | Admitting: Family Medicine

## 2018-10-08 ENCOUNTER — Ambulatory Visit (INDEPENDENT_AMBULATORY_CARE_PROVIDER_SITE_OTHER): Payer: 59 | Admitting: Family Medicine

## 2018-10-08 VITALS — BP 138/80 | HR 69 | Temp 97.9°F | Ht 74.5 in | Wt 307.0 lb

## 2018-10-08 DIAGNOSIS — Z0000 Encounter for general adult medical examination without abnormal findings: Secondary | ICD-10-CM | POA: Diagnosis not present

## 2018-10-08 DIAGNOSIS — Z23 Encounter for immunization: Secondary | ICD-10-CM | POA: Diagnosis not present

## 2018-10-08 LAB — CBC WITH DIFFERENTIAL/PLATELET
Basophils Absolute: 0 10*3/uL (ref 0.0–0.1)
Basophils Relative: 0.7 % (ref 0.0–3.0)
Eosinophils Absolute: 0.2 10*3/uL (ref 0.0–0.7)
Eosinophils Relative: 4.3 % (ref 0.0–5.0)
HCT: 38.8 % — ABNORMAL LOW (ref 39.0–52.0)
Hemoglobin: 12.9 g/dL — ABNORMAL LOW (ref 13.0–17.0)
Lymphocytes Relative: 42.6 % (ref 12.0–46.0)
Lymphs Abs: 1.8 10*3/uL (ref 0.7–4.0)
MCHC: 33.3 g/dL (ref 30.0–36.0)
MCV: 87 fl (ref 78.0–100.0)
Monocytes Absolute: 0.6 10*3/uL (ref 0.1–1.0)
Monocytes Relative: 13.5 % — ABNORMAL HIGH (ref 3.0–12.0)
Neutro Abs: 1.6 10*3/uL (ref 1.4–7.7)
Neutrophils Relative %: 38.9 % — ABNORMAL LOW (ref 43.0–77.0)
Platelets: 177 10*3/uL (ref 150.0–400.0)
RBC: 4.46 Mil/uL (ref 4.22–5.81)
RDW: 15.3 % (ref 11.5–15.5)
WBC: 4.2 10*3/uL (ref 4.0–10.5)

## 2018-10-08 LAB — TSH: TSH: 1.01 u[IU]/mL (ref 0.35–4.50)

## 2018-10-08 LAB — BASIC METABOLIC PANEL
BUN: 17 mg/dL (ref 6–23)
CO2: 33 mEq/L — ABNORMAL HIGH (ref 19–32)
Calcium: 8.7 mg/dL (ref 8.4–10.5)
Chloride: 103 mEq/L (ref 96–112)
Creatinine, Ser: 1.02 mg/dL (ref 0.40–1.50)
GFR: 90.72 mL/min (ref >60.00)
Glucose, Bld: 94 mg/dL (ref 70–99)
Potassium: 3.5 mEq/L (ref 3.5–5.1)
Sodium: 141 mEq/L (ref 135–145)

## 2018-10-08 LAB — LIPID PANEL
Cholesterol: 244 mg/dL — ABNORMAL HIGH (ref 0–200)
HDL: 50.2 mg/dL (ref >39.00)
LDL Cholesterol: 171 mg/dL — ABNORMAL HIGH (ref 0–99)
NonHDL: 193.45
Total CHOL/HDL Ratio: 5
Triglycerides: 113 mg/dL (ref 0.0–149.0)
VLDL: 22.6 mg/dL (ref 0.0–40.0)

## 2018-10-08 LAB — HEPATIC FUNCTION PANEL
ALT: 22 U/L (ref 0–53)
AST: 22 U/L (ref 0–37)
Albumin: 4.1 g/dL (ref 3.5–5.2)
Alkaline Phosphatase: 80 U/L (ref 39–117)
Bilirubin, Direct: 0.1 mg/dL (ref 0.0–0.3)
Total Bilirubin: 0.5 mg/dL (ref 0.2–1.2)
Total Protein: 6.8 g/dL (ref 6.0–8.3)

## 2018-10-08 LAB — PSA: PSA: 2.5 ng/mL (ref 0.10–4.00)

## 2018-10-08 NOTE — Addendum Note (Signed)
Addended by: Anibal Henderson on: 10/08/2018 10:47 AM   Modules accepted: Orders

## 2018-10-08 NOTE — Progress Notes (Addendum)
Subjective:     Patient ID: Eric Mann, male   DOB: [birth year], 58 y.o.   MRN: [id number]  HPI Patient is here for physical exam.  He did test positive for COVID back in July and then had follow-up test August 12 which was negative.  He had some loss of taste and smell and some body aches and fever.  Totally asymptomatic at this time.  He has hypertension which is treated with amlodipine and stable.  He was placed on atorvastatin last year for hyperlipidemia and tolerating well.  He takes allopurinol for gout prevention.  No recent gout flareups.  He did lose some weight with recent coronavirus infection in hopes to maintain that.  Still needs flu vaccine.  No history of shingles vaccine.  Tetanus up-to-date.  Due for repeat colonoscopy 2022.  Previous hepatitis C screen negative.  Past Medical History:  Diagnosis Date  . Arthritis    lt. knee  . Glaucoma    not on medicine yet  . Gout   . Gout   . Hyperlipidemia   . Hypertension    Past Surgical History:  Procedure Laterality Date  . COLONOSCOPY    . KNEE ARTHROSCOPY Left 06/24/2017  . POLYPECTOMY      reports that he has never smoked. He has never used smokeless tobacco. He reports that he does not drink alcohol or use drugs. family history includes Cancer in his sister; Diabetes in his brother, mother, and sister; Heart disease in his mother; Hypertension in his mother. No Known Allergies   The 10-year ASCVD risk score Mikey Bussing DC Jr., et al., 2013) is: 15%   Values used to calculate the score:     Age: 26 years     Sex: Male     Is Non-Hispanic African American: Yes     Diabetic: No     Tobacco smoker: No     Systolic Blood Pressure: 0000000 mmHg     Is BP treated: Yes     HDL Cholesterol: 50.2 mg/dL     Total Cholesterol: 244 mg/dL    Review of Systems  Constitutional: Negative for activity change, appetite change, fatigue and fever.  HENT: Negative for congestion, ear pain and trouble swallowing.   Eyes: Negative for  pain and visual disturbance.  Respiratory: Negative for cough, shortness of breath and wheezing.   Cardiovascular: Negative for chest pain and palpitations.  Gastrointestinal: Negative for abdominal distention, abdominal pain, blood in stool, constipation, diarrhea, nausea, rectal pain and vomiting.  Genitourinary: Negative for dysuria, hematuria and testicular pain.  Musculoskeletal: Negative for arthralgias and joint swelling.  Skin: Negative for rash.  Neurological: Negative for dizziness, syncope and headaches.  Hematological: Negative for adenopathy.  Psychiatric/Behavioral: Negative for confusion and dysphoric mood.       Objective:   Physical Exam Constitutional:      General: He is not in acute distress.    Appearance: He is well-developed.  HENT:     Head: Normocephalic and atraumatic.     Right Ear: External ear normal.     Left Ear: External ear normal.  Eyes:     Conjunctiva/sclera: Conjunctivae normal.     Pupils: Pupils are equal, round, and reactive to light.  Neck:     Musculoskeletal: Normal range of motion and neck supple.     Thyroid: No thyromegaly.  Cardiovascular:     Rate and Rhythm: Normal rate and regular rhythm.     Heart sounds: Normal heart sounds. No murmur.  Pulmonary:     Effort: No respiratory distress.     Breath sounds: No wheezing or rales.  Abdominal:     General: Bowel sounds are normal. There is no distension.     Palpations: Abdomen is soft. There is no mass.     Tenderness: There is no abdominal tenderness. There is no guarding or rebound.  Lymphadenopathy:     Cervical: No cervical adenopathy.  Skin:    Findings: No rash.  Neurological:     Mental Status: He is alert and oriented to person, place, and time.     Cranial Nerves: No cranial nerve deficit.     Deep Tendon Reflexes: Reflexes normal.        Assessment:     Physical exam.  Patient has hypertension which is reasonably well controlled.  Recent coronavirus infection  as above with subsequent negative test    Plan:     -Flu vaccine given -Check screening labs including PSA after discussion of pros and cons -We have encouraged him to continue with weight loss efforts -Discussed shingles vaccine.  He is encouraged to check on coverage  Eulas Post MD [facility] at [facility]

## 2018-10-12 ENCOUNTER — Other Ambulatory Visit: Payer: Self-pay | Admitting: *Deleted

## 2018-10-12 DIAGNOSIS — E785 Hyperlipidemia, unspecified: Secondary | ICD-10-CM

## 2018-10-12 DIAGNOSIS — R972 Elevated prostate specific antigen [PSA]: Secondary | ICD-10-CM

## 2018-10-12 MED ORDER — ATORVASTATIN CALCIUM 40 MG PO TABS: 40.0000 mg | ORAL_TABLET | Freq: Every day | ORAL | 1 refills | Status: DC

## 2019-01-12 ENCOUNTER — Other Ambulatory Visit (INDEPENDENT_AMBULATORY_CARE_PROVIDER_SITE_OTHER): Payer: 59

## 2019-01-12 ENCOUNTER — Other Ambulatory Visit: Payer: Self-pay

## 2019-01-12 ENCOUNTER — Encounter: Payer: Self-pay | Admitting: Family Medicine

## 2019-01-12 DIAGNOSIS — R972 Elevated prostate specific antigen [PSA]: Secondary | ICD-10-CM | POA: Diagnosis not present

## 2019-01-12 DIAGNOSIS — E785 Hyperlipidemia, unspecified: Secondary | ICD-10-CM | POA: Diagnosis not present

## 2019-01-12 LAB — LIPID PANEL
Cholesterol: 201 mg/dL — ABNORMAL HIGH (ref 0–200)
HDL: 47.4 mg/dL (ref >39.00)
LDL Cholesterol: 138 mg/dL — ABNORMAL HIGH (ref 0–99)
NonHDL: 153.82
Total CHOL/HDL Ratio: 4
Triglycerides: 77 mg/dL (ref 0.0–149.0)
VLDL: 15.4 mg/dL (ref 0.0–40.0)

## 2019-01-12 LAB — PSA: PSA: 1.75 ng/mL (ref 0.10–4.00)

## 2019-04-14 ENCOUNTER — Telehealth: Payer: Self-pay | Admitting: Family Medicine

## 2019-04-14 NOTE — Telephone Encounter (Signed)
Medication Refill: Allopurinol  Pharmacy: [facility] FAX: [phone]   Pt is out.

## 2019-04-14 NOTE — Telephone Encounter (Signed)
Message sent to PCP for approval 

## 2019-04-15 ENCOUNTER — Other Ambulatory Visit: Payer: Self-pay | Admitting: *Deleted

## 2019-04-15 MED ORDER — ALLOPURINOL 300 MG PO TABS: 300.0000 mg | ORAL_TABLET | Freq: Every day | ORAL | 3 refills | Status: DC

## 2019-04-15 NOTE — Telephone Encounter (Signed)
Rx sent to the pharmacy.

## 2019-04-15 NOTE — Telephone Encounter (Signed)
Refill for one year 

## 2019-04-21 ENCOUNTER — Other Ambulatory Visit: Payer: Self-pay | Admitting: *Deleted

## 2019-04-21 MED ORDER — AMLODIPINE BESYLATE 5 MG PO TABS: 5.0000 mg | ORAL_TABLET | Freq: Every day | ORAL | 0 refills | Status: DC

## 2019-07-03 ENCOUNTER — Other Ambulatory Visit: Payer: Self-pay | Admitting: Family Medicine

## 2019-09-16 ENCOUNTER — Ambulatory Visit (INDEPENDENT_AMBULATORY_CARE_PROVIDER_SITE_OTHER): Payer: 59 | Admitting: Family Medicine

## 2019-09-16 ENCOUNTER — Other Ambulatory Visit: Payer: Self-pay

## 2019-09-16 ENCOUNTER — Encounter: Payer: Self-pay | Admitting: Family Medicine

## 2019-09-16 VITALS — BP 162/88 | HR 64 | Temp 97.9°F | Wt 317.2 lb

## 2019-09-16 DIAGNOSIS — I10 Essential (primary) hypertension: Secondary | ICD-10-CM

## 2019-09-16 DIAGNOSIS — R21 Rash and other nonspecific skin eruption: Secondary | ICD-10-CM

## 2019-09-16 MED ORDER — AMLODIPINE BESYLATE 5 MG PO TABS: ORAL_TABLET | ORAL | 3 refills | Status: DC

## 2019-09-16 MED ORDER — CICLOPIROX OLAMINE 0.77 % EX CREA: TOPICAL_CREAM | Freq: Two times a day (BID) | CUTANEOUS | 1 refills | Status: DC

## 2019-09-16 NOTE — Patient Instructions (Signed)
Monitor blood pressure back on Amlodipine and be in touch if consistently > 140/90.    Try to lose some weight, as discussed.

## 2019-09-16 NOTE — Progress Notes (Signed)
Established Patient Office Visit  Subjective:  Patient ID: Eric Mann, male    DOB: 05-24-1960  Age: 59 y.o. MRN: [id number]  CC:  Chief Complaint  Patient presents with  . Hypertension    pt states he  has been having headaches and believes his BP is high     HPI Eric Mann presents for blood pressure follow-up.  He has hypertension and has been on amlodipine.  He apparently ran out of medication back in May and states he was unable to get this refilled.  He has been not taking any medicine since that time.  He is recently had some lightheadedness and occasional headaches and he thought that was probably related to his blood pressure being up.  He does have a home monitor but has not been monitoring his blood pressure regularly.  Does not add any extra salt.  No regular alcohol use.  Weight is up some from last year.  He has another issue of pruritic rash lower waist.  He has fairly large pannus that overhangs and he retains a lot of moisture under this region.  Has had some pruritic rash.  He has not tried any medications.  Pruritus is moderate at times.  Past Medical History:  Diagnosis Date  . Arthritis    lt. knee  . Glaucoma    not on medicine yet  . Gout   . Gout   . Hyperlipidemia   . Hypertension     Past Surgical History:  Procedure Laterality Date  . COLONOSCOPY    . KNEE ARTHROSCOPY Left 06/24/2017  . POLYPECTOMY      Family History  Problem Relation Age of Onset  . Heart disease Mother   . Hypertension Mother   . Diabetes Mother   . Diabetes Sister   . Cancer Sister        cervix cancer  . Diabetes Brother   . Colon cancer Neg Hx   . Esophageal cancer Neg Hx   . Rectal cancer Neg Hx   . Stomach cancer Neg Hx   . Colon polyps Neg Hx     Social History   Socioeconomic History  . Marital status: Married    Spouse name: Not on file  . Number of children: Not on file  . Years of education: Not on file  . Highest education level: Not on file    Occupational History  . Not on file  Tobacco Use  . Smoking status: Never Smoker  . Smokeless tobacco: Never Used  Vaping Use  . Vaping Use: Never used  Substance and Sexual Activity  . Alcohol use: No    Alcohol/week: 0.0 standard drinks    Comment: wine occasionally  . Drug use: No  . Sexual activity: Not on file  Other Topics Concern  . Not on file  Social History Narrative  . Not on file   Social Determinants of Health   Financial Resource Strain:   . Difficulty of Paying Living Expenses:   Food Insecurity:   . Worried About Charity fundraiser in the Last Year:   . Arboriculturist in the Last Year:   Transportation Needs:   . Film/video editor (Medical):   Marland Kitchen Lack of Transportation (Non-Medical):   Physical Activity:   . Days of Exercise per Week:   . Minutes of Exercise per Session:   Stress:   . Feeling of Stress :   Social Connections:   . Frequency  of Communication with Friends and Family:   . Frequency of Social Gatherings with Friends and Family:   . Attends Religious Services:   . Active Member of Clubs or Organizations:   . Attends Archivist Meetings:   Marland Kitchen Marital Status:   Intimate Partner Violence:   . Fear of Current or Ex-Partner:   . Emotionally Abused:   Marland Kitchen Physically Abused:   . Sexually Abused:     Outpatient Medications Prior to Visit  Medication Sig Dispense Refill  . allopurinol (ZYLOPRIM) 300 MG tablet Take 1 tablet (300 mg total) by mouth daily. 90 tablet 3  . atorvastatin (LIPITOR) 40 MG tablet Take 1 tablet (40 mg total) by mouth daily. 90 tablet 1  . colchicine (COLCRYS) 0.6 MG tablet Take 1 tablet (0.6 mg total) by mouth daily. 60 tablet 1  . naproxen (NAPROSYN) 500 MG tablet Take 1 tablet (500 mg total) by mouth 2 (two) times daily. 60 tablet 0  . amLODipine (NORVASC) 5 MG tablet TAKE 1 TABLET(5 MG) BY MOUTH DAILY 30 tablet 0  . sildenafil (VIAGRA) 100 MG tablet Take 1 tablet (100 mg total) by mouth daily as needed  for erectile dysfunction. 6 tablet 11   Facility-Administered Medications Prior to Visit  Medication Dose Route Frequency Provider Last Rate Last Admin  . 0.9 %  sodium chloride infusion  500 mL Intravenous Once Irene Shipper, MD      . methylPREDNISolone acetate (DEPO-MEDROL) injection 80 mg  80 mg Intramuscular Once McKeag, Marylynn Pearson, MD        No Known Allergies  ROS Review of Systems  Constitutional: Negative for appetite change.  Respiratory: Negative for shortness of breath.   Cardiovascular: Negative for chest pain.  Gastrointestinal: Negative for abdominal pain.  Genitourinary: Negative for dysuria.  Skin: Positive for rash.  Neurological: Positive for light-headedness and headaches. Negative for syncope.      Objective:    Physical Exam Vitals reviewed.  Constitutional:      Appearance: He is obese.  Cardiovascular:     Rate and Rhythm: Normal rate and regular rhythm.  Pulmonary:     Effort: Pulmonary effort is normal.     Breath sounds: Normal breath sounds.  Skin:    Comments: He has a large abdominal pannus.  With lifting this up he has some hyperpigmentation and erythema in the crease just underneath the pannus.  This stretches across bilaterally  Neurological:     Mental Status: He is alert.     BP (!) 162/88 (BP Location: Left Arm, Cuff Size: Large)   Pulse 64   Temp 97.9 F (36.6 C) (Oral)   Wt (!) 317 lb 3.2 oz (143.9 kg)   SpO2 97%   BMI 40.18 kg/m  Wt Readings from Last 3 Encounters:  09/16/19 (!) 317 lb 3.2 oz (143.9 kg)  10/08/18 (!) 307 lb (139.3 kg)  12/23/17 (!) 319 lb (144.7 kg)     Health Maintenance Due  Topic Date Due  . COVID-19 Vaccine (1) Never done    There are no preventive care reminders to display for this patient.  Lab Results  Component Value Date   TSH 1.01 10/08/2018   Lab Results  Component Value Date   WBC 4.2 10/08/2018   HGB 12.9 (L) 10/08/2018   HCT 38.8 (L) 10/08/2018   MCV 87.0 10/08/2018   PLT 177.0  10/08/2018   Lab Results  Component Value Date   NA 141 10/08/2018   K 3.5  10/08/2018   CO2 33 (H) 10/08/2018   GLUCOSE 94 10/08/2018   BUN 17 10/08/2018   CREATININE 1.02 10/08/2018   BILITOT 0.5 10/08/2018   ALKPHOS 80 10/08/2018   AST 22 10/08/2018   ALT 22 10/08/2018   PROT 6.8 10/08/2018   ALBUMIN 4.1 10/08/2018   CALCIUM 8.7 10/08/2018   GFR 90.72 10/08/2018   Lab Results  Component Value Date   CHOL 201 (H) 01/12/2019   Lab Results  Component Value Date   HDL 47.40 01/12/2019   Lab Results  Component Value Date   LDLCALC 138 (H) 01/12/2019   Lab Results  Component Value Date   TRIG 77.0 01/12/2019   Lab Results  Component Value Date   CHOLHDL 4 01/12/2019   No results found for: HGBA1C    Assessment & Plan:   #1 hypertension currently not well controlled with exacerbation off medication.  Repeat by me right arm seated at rest with large cuff 162/88  -Get back on amlodipine 5 mg daily -Strongly advocate a trial lose some weight.  We would like to see his weight eventually under 300 pounds -Establish more consistent aerobic exercise -Set up office follow-up within 3 months to reassess  #2 skin rash lower abdominal region.  Suspect probably fungal given location  -Keep area dry as possible -Recommend trial of Loprox cream to use twice daily and be in touch if this is not improving over the next few weeks  Meds ordered this encounter  Medications  . amLODipine (NORVASC) 5 MG tablet    Sig: TAKE 1 TABLET(5 MG) BY MOUTH DAILY    Dispense:  90 tablet    Refill:  3  . ciclopirox (LOPROX) 0.77 % cream    Sig: Apply topically 2 (two) times daily.    Dispense:  30 g    Refill:  1    Follow-up: Return in about 3 months (around 12/17/2019).    Carolann Littler, MD

## 2019-12-13 IMAGING — MR MR KNEE*L* W/O CM
4 of 6 series · 19 of 40 positions shown · non-contrast
Comparison: None.

CLINICAL DATA: Left knee pain for 4-5 weeks

EXAM:
MRI OF THE LEFT KNEE WITHOUT CONTRAST
TECHNIQUE: Multiplanar, multisequence MR imaging of the knee was performed. No
intravenous contrast was administered.

[Series 3: PD fat-sat · axial · 4.0mm · 0.31mm/px · z∈[-50,+73]mm · 8 of 29 slices shown (1 of 4)]
[im 1/29]
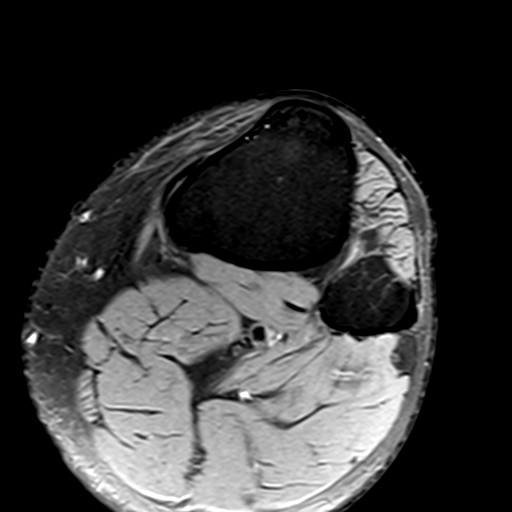
[im 5/29]
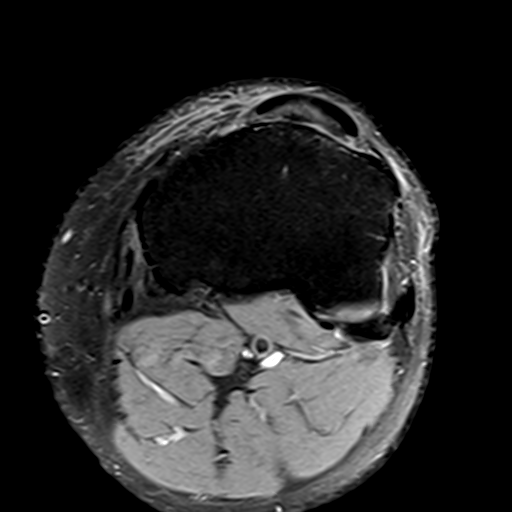
[im 9/29]
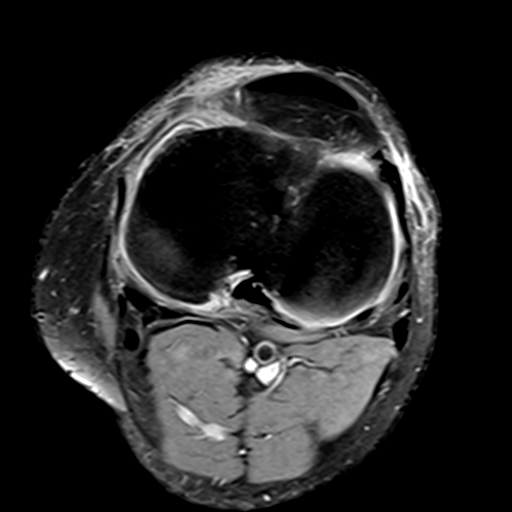
[im 13/29]
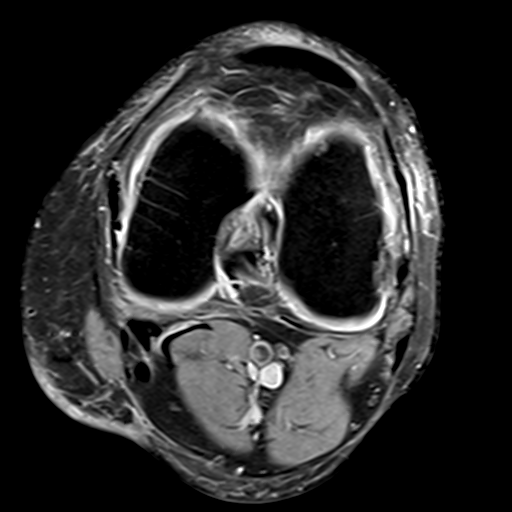
[im 17/29]
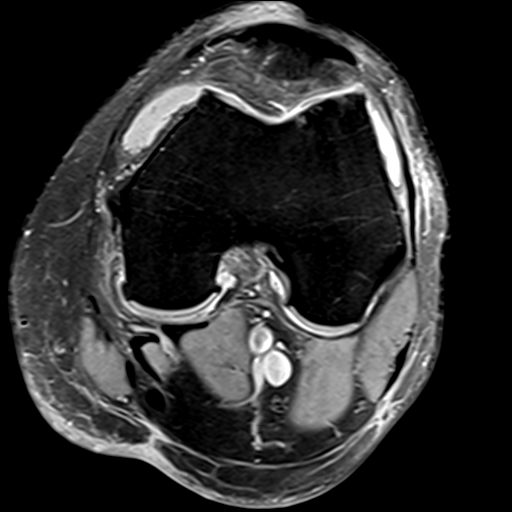
[im 21/29]
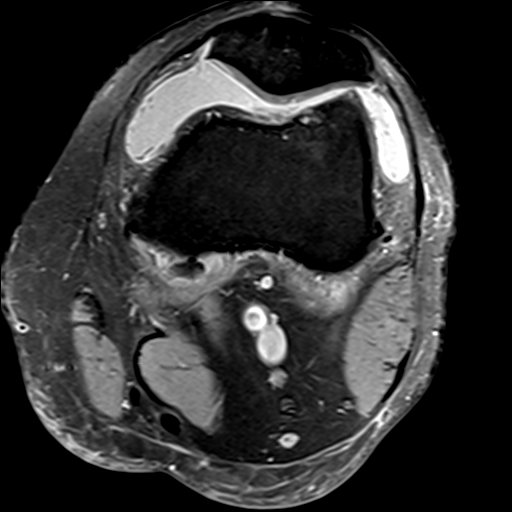
[im 25/29]
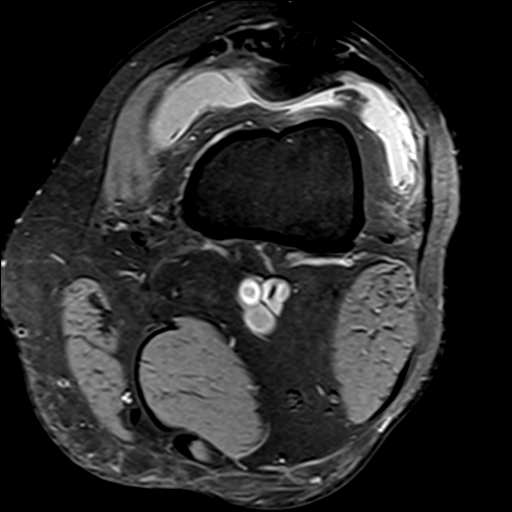
[im 29/29]
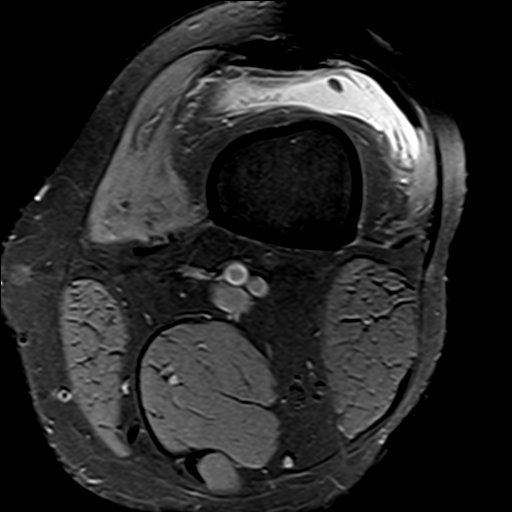

[Series 5: PD fat-sat · sagittal · 4.0mm · 0.33mm/px · 5 of 25 slices shown (2 of 4)]
[im 1/25]
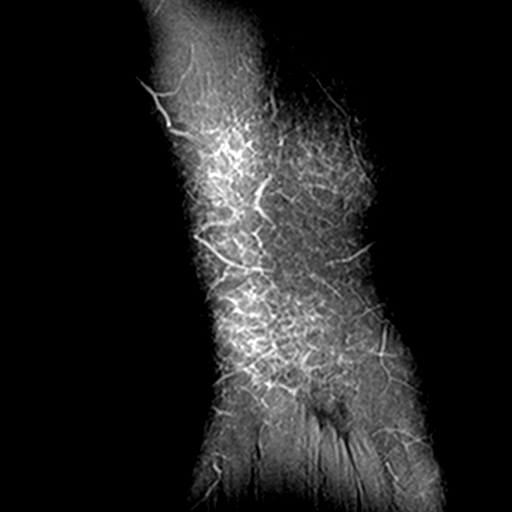
[im 5/25]
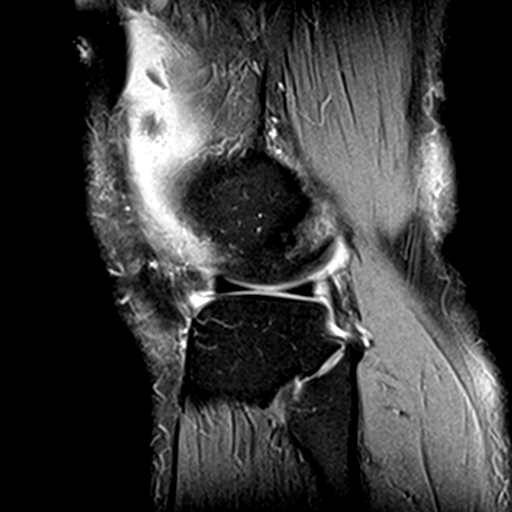
[im 9/25]
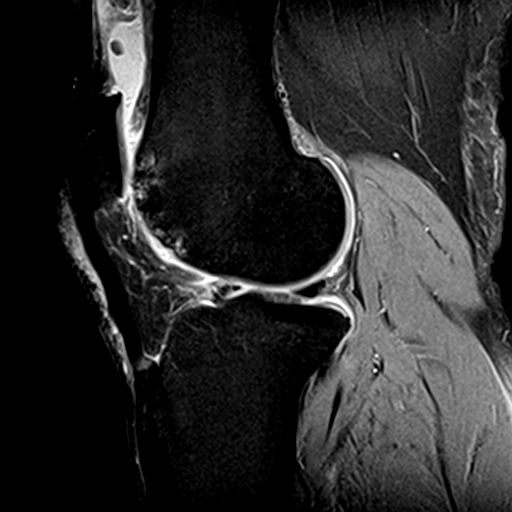
[im 13/25]
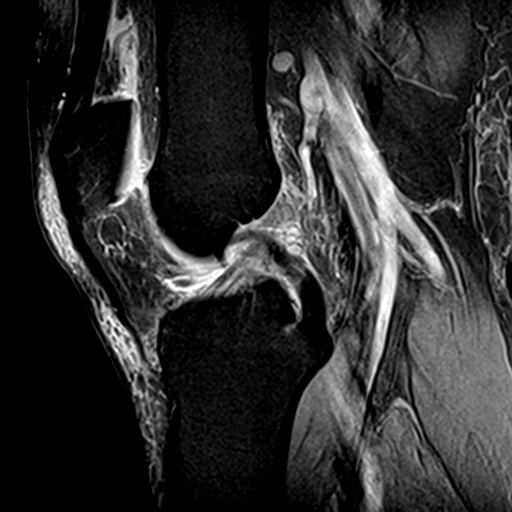
[im 21/25]
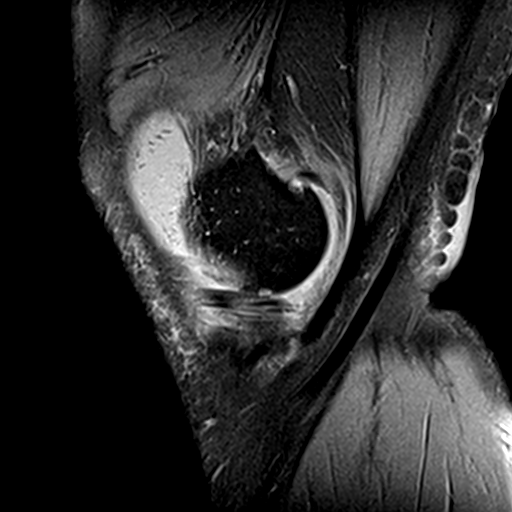

[Series 7: PD fat-sat · coronal · 4.0mm · 0.31mm/px · 3 of 25 slices shown (3 of 4)]
[im 5/25]
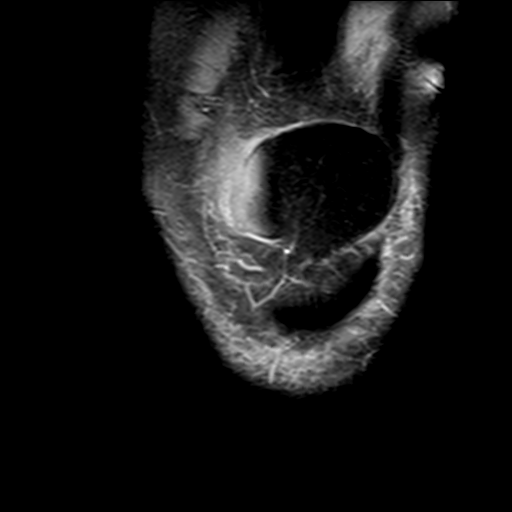
[im 13/25]
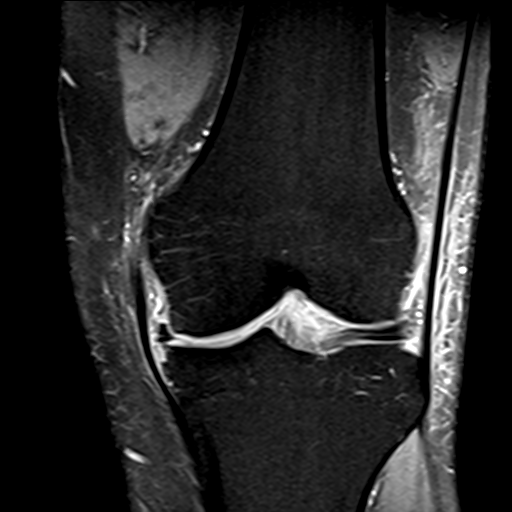
[im 21/25]
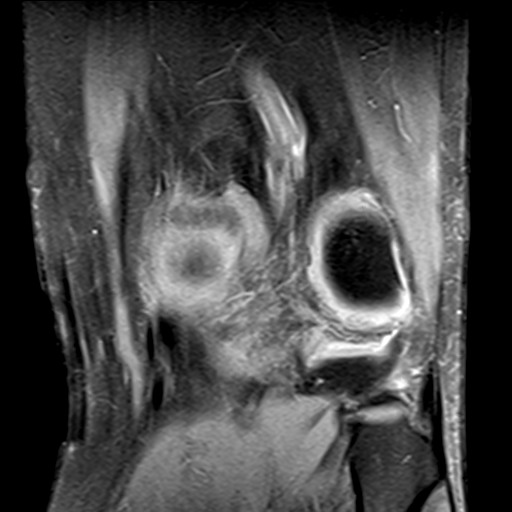

[Series 8: PD fat-sat · oblique · 2.0mm · 0.29mm/px · 3 of 13 slices shown (4 of 4)]
[im 1/13]
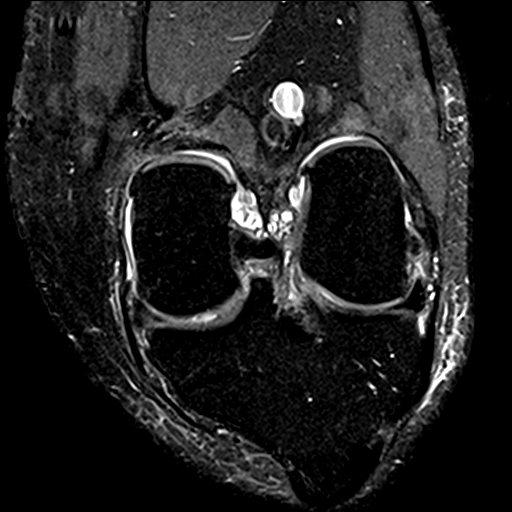
[im 9/13]
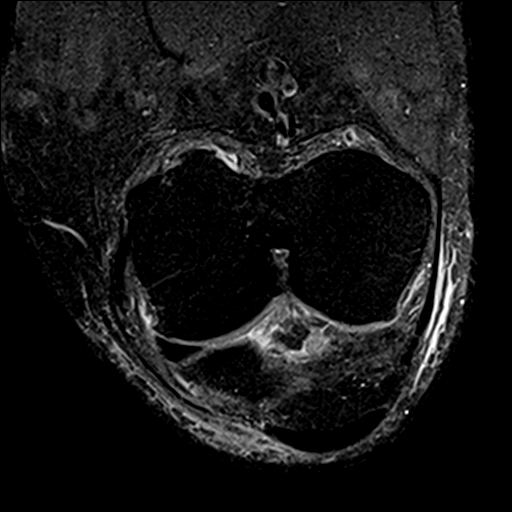
[im 13/13]
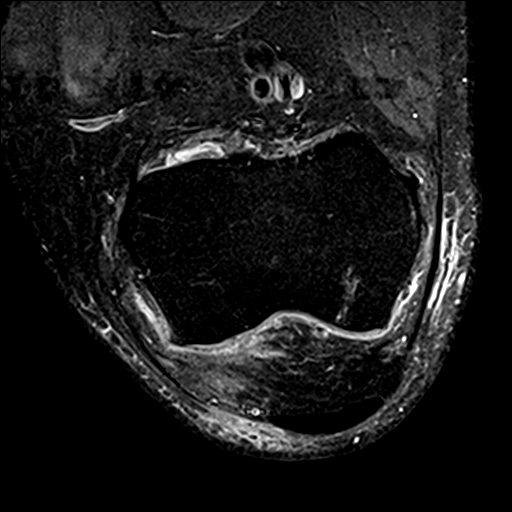

[19 of 40 positions shown; findings below may reference images not displayed]

FINDINGS: MENISCI

Medial meniscus: Oblique tear of the posterior horn-body junction of
the medial meniscus extending to the inferior articular surface.

Lateral meniscus: Mild complex tear of the anterior horn of the
lateral meniscus at the root.

LIGAMENTS

Cruciates: Intact ACL and PCL. Medial collateral ligament is intact.
Lateral collateral ligament complex is intact.

Collaterals: Medial collateral ligament is intact. Lateral
collateral ligament complex is intact.

CARTILAGE

Patellofemoral: Mild cartilage fissuring of the patellar apex.
Extensive full-thickness cartilage loss of lateral trochlea with
subchondral reactive marrow changes.

Medial: Partial-thickness cartilage loss with a small area of
full-thickness cartilage loss measuring 6.9 mm involving the medial
femoral condyle. Partial-thickness cartilage loss of the medial
tibial plateau.

Lateral:  Mild chondral thinning with small marginal osteophytes.

Joint: Moderate joint effusion. Edema in Hoffa's fat. No plical
thickening.

Popliteal Fossa:  No Baker's cyst.  Intact popliteus tendon.

Extensor Mechanism: Intact quadriceps tendon. Intact patellar
tendon. Intact medial patellar retinaculum. Intact lateral patellar
retinaculum. Intact MPFL.

Bones:  No acute osseous abnormality.  No aggressive osseous lesion.

Other: No fluid collection or hematoma.
IMPRESSION: 1. Oblique tear of the posterior horn-body junction of the medial
meniscus extending to the inferior articular surface.
2. Mild complex tear of the anterior horn of the lateral meniscus at
the root.
3. Tricompartmental cartilage abnormalities as described above.

## 2019-12-16 ENCOUNTER — Ambulatory Visit: Payer: 59 | Admitting: Family Medicine

## 2019-12-19 ENCOUNTER — Ambulatory Visit (INDEPENDENT_AMBULATORY_CARE_PROVIDER_SITE_OTHER): Payer: 59 | Admitting: Family Medicine

## 2019-12-19 ENCOUNTER — Encounter: Payer: Self-pay | Admitting: Family Medicine

## 2019-12-19 ENCOUNTER — Other Ambulatory Visit: Payer: Self-pay

## 2019-12-19 VITALS — BP 160/100 | HR 62 | Temp 98.6°F | Ht 75.5 in | Wt 317.7 lb

## 2019-12-19 DIAGNOSIS — I10 Essential (primary) hypertension: Secondary | ICD-10-CM

## 2019-12-19 DIAGNOSIS — Z23 Encounter for immunization: Secondary | ICD-10-CM | POA: Diagnosis not present

## 2019-12-19 MED ORDER — LOSARTAN POTASSIUM-HCTZ 50-12.5 MG PO TABS: 1.0000 | ORAL_TABLET | Freq: Every day | ORAL | 3 refills | Status: DC

## 2019-12-19 NOTE — Patient Instructions (Signed)

## 2019-12-19 NOTE — Progress Notes (Signed)
Established Patient Office Visit  Subjective:  Patient ID: Eric Mann, male    DOB: Aug 16, 1960  Age: 59 y.o. MRN: [id number]  CC:  Chief Complaint  Patient presents with  . Follow-up    3 month follow  up, getting high b/p reading at home around 160 on the top    HPI Eric Mann presents for 64-month hypertension follow-up.  Currently takes amlodipine 5 mg daily.  When he seen last few months ago he had not been taking his medication regularly and he had elevated blood pressure at that time.  We recommended getting back on medication along with regular exercise and weight loss.  Unfortunately, his weight is the same.  He is compliant with amlodipine 5 mg daily.  No side effects.  Denies any recent headaches or dizziness.  Has been monitoring blood pressure at home and consistent readings 903E systolic and around 092 diastolic.  No nonsteroidal use.  No alcohol use.  Tries to watch sodium intake fairly closely.  He does have gout history but this is controlled with allopurinol.  Past Medical History:  Diagnosis Date  . Arthritis    lt. knee  . Glaucoma    not on medicine yet  . Gout   . Gout   . Hyperlipidemia   . Hypertension     Past Surgical History:  Procedure Laterality Date  . COLONOSCOPY    . KNEE ARTHROSCOPY Left 06/24/2017  . POLYPECTOMY      Family History  Problem Relation Age of Onset  . Heart disease Mother   . Hypertension Mother   . Diabetes Mother   . Diabetes Sister   . Cancer Sister        cervix cancer  . Diabetes Brother   . Colon cancer Neg Hx   . Esophageal cancer Neg Hx   . Rectal cancer Neg Hx   . Stomach cancer Neg Hx   . Colon polyps Neg Hx     Social History   Socioeconomic History  . Marital status: Married    Spouse name: Not on file  . Number of children: Not on file  . Years of education: Not on file  . Highest education level: Not on file  Occupational History  . Not on file  Tobacco Use  . Smoking status: Never Smoker   . Smokeless tobacco: Never Used  Vaping Use  . Vaping Use: Never used  Substance and Sexual Activity  . Alcohol use: No    Alcohol/week: 0.0 standard drinks    Comment: wine occasionally  . Drug use: No  . Sexual activity: Not on file  Other Topics Concern  . Not on file  Social History Narrative  . Not on file   Social Determinants of Health   Financial Resource Strain:   . Difficulty of Paying Living Expenses: Not on file  Food Insecurity:   . Worried About Charity fundraiser in the Last Year: Not on file  . Ran Out of Food in the Last Year: Not on file  Transportation Needs:   . Lack of Transportation (Medical): Not on file  . Lack of Transportation (Non-Medical): Not on file  Physical Activity:   . Days of Exercise per Week: Not on file  . Minutes of Exercise per Session: Not on file  Stress:   . Feeling of Stress : Not on file  Social Connections:   . Frequency of Communication with Friends and Family: Not on file  .  Frequency of Social Gatherings with Friends and Family: Not on file  . Attends Religious Services: Not on file  . Active Member of Clubs or Organizations: Not on file  . Attends Archivist Meetings: Not on file  . Marital Status: Not on file  Intimate Partner Violence:   . Fear of Current or Ex-Partner: Not on file  . Emotionally Abused: Not on file  . Physically Abused: Not on file  . Sexually Abused: Not on file    Outpatient Medications Prior to Visit  Medication Sig Dispense Refill  . allopurinol (ZYLOPRIM) 300 MG tablet Take 1 tablet (300 mg total) by mouth daily. 90 tablet 3  . amLODipine (NORVASC) 5 MG tablet TAKE 1 TABLET(5 MG) BY MOUTH DAILY 90 tablet 3  . ciclopirox (LOPROX) 0.77 % cream Apply topically 2 (two) times daily. 30 g 1  . colchicine (COLCRYS) 0.6 MG tablet Take 1 tablet (0.6 mg total) by mouth daily. 60 tablet 1  . atorvastatin (LIPITOR) 40 MG tablet Take 1 tablet (40 mg total) by mouth daily. (Patient not taking:  Reported on 12/19/2019) 90 tablet 1  . naproxen (NAPROSYN) 500 MG tablet Take 1 tablet (500 mg total) by mouth 2 (two) times daily. (Patient not taking: Reported on 12/19/2019) 60 tablet 0  . sildenafil (VIAGRA) 100 MG tablet Take 1 tablet (100 mg total) by mouth daily as needed for erectile dysfunction. 6 tablet 11   Facility-Administered Medications Prior to Visit  Medication Dose Route Frequency Provider Last Rate Last Admin  . 0.9 %  sodium chloride infusion  500 mL Intravenous Once Irene Shipper, MD      . methylPREDNISolone acetate (DEPO-MEDROL) injection 80 mg  80 mg Intramuscular Once McKeag, Marylynn Pearson, MD        No Known Allergies  ROS Review of Systems  Constitutional: Negative for fatigue.  Eyes: Negative for visual disturbance.  Respiratory: Negative for cough, chest tightness and shortness of breath.   Cardiovascular: Negative for chest pain, palpitations and leg swelling.  Neurological: Negative for dizziness, syncope, weakness, light-headedness and headaches.      Objective:    Physical Exam Constitutional:      Appearance: He is well-developed.  HENT:     Right Ear: External ear normal.     Left Ear: External ear normal.  Eyes:     Pupils: Pupils are equal, round, and reactive to light.  Neck:     Thyroid: No thyromegaly.  Cardiovascular:     Rate and Rhythm: Normal rate and regular rhythm.  Pulmonary:     Effort: Pulmonary effort is normal. No respiratory distress.     Breath sounds: Normal breath sounds. No wheezing or rales.  Musculoskeletal:     Cervical back: Neck supple.  Neurological:     Mental Status: He is alert and oriented to person, place, and time.     BP (!) 160/100 (BP Location: Left Arm, Patient Position: Sitting, Cuff Size: Large)   Pulse 62   Temp 98.6 F (37 C) (Oral)   Ht 6' 3.5" (1.918 m)   Wt (!) 317 lb 11.2 oz (144.1 kg)   SpO2 98%   BMI 39.19 kg/m  Wt Readings from Last 3 Encounters:  12/19/19 (!) 317 lb 11.2 oz (144.1 kg)    09/16/19 (!) 317 lb 3.2 oz (143.9 kg)  10/08/18 (!) 307 lb (139.3 kg)     Health Maintenance Due  Topic Date Due  . COVID-19 Vaccine (1) Never done  There are no preventive care reminders to display for this patient.  Lab Results  Component Value Date   TSH 1.01 10/08/2018   Lab Results  Component Value Date   WBC 4.2 10/08/2018   HGB 12.9 (L) 10/08/2018   HCT 38.8 (L) 10/08/2018   MCV 87.0 10/08/2018   PLT 177.0 10/08/2018   Lab Results  Component Value Date   NA 141 10/08/2018   K 3.5 10/08/2018   CO2 33 (H) 10/08/2018   GLUCOSE 94 10/08/2018   BUN 17 10/08/2018   CREATININE 1.02 10/08/2018   BILITOT 0.5 10/08/2018   ALKPHOS 80 10/08/2018   AST 22 10/08/2018   ALT 22 10/08/2018   PROT 6.8 10/08/2018   ALBUMIN 4.1 10/08/2018   CALCIUM 8.7 10/08/2018   GFR 90.72 10/08/2018   Lab Results  Component Value Date   CHOL 201 (H) 01/12/2019   Lab Results  Component Value Date   HDL 47.40 01/12/2019   Lab Results  Component Value Date   LDLCALC 138 (H) 01/12/2019   Lab Results  Component Value Date   TRIG 77.0 01/12/2019   Lab Results  Component Value Date   CHOLHDL 4 01/12/2019   No results found for: HGBA1C    Assessment & Plan:   Hypertension.  Poorly controlled by home readings as well as readings here today  -Continue low-sodium diet -Again advocate weight loss and regular aerobic exercise -Start losartan HCTZ 50/12.5 mg 1 daily and continue amlodipine 5 mg daily -Follow-up in 1 month to reassess blood pressure.  Reassess basic metabolic panel then.  Meds ordered this encounter  Medications  . losartan-hydrochlorothiazide (HYZAAR) 50-12.5 MG tablet    Sig: Take 1 tablet by mouth daily.    Dispense:  90 tablet    Refill:  3    Follow-up: Return in about 1 month (around 01/18/2020).    Carolann Littler, MD

## 2020-01-24 ENCOUNTER — Other Ambulatory Visit: Payer: Self-pay

## 2020-01-24 ENCOUNTER — Ambulatory Visit (INDEPENDENT_AMBULATORY_CARE_PROVIDER_SITE_OTHER): Payer: 59 | Admitting: Family Medicine

## 2020-01-24 ENCOUNTER — Encounter: Payer: Self-pay | Admitting: Family Medicine

## 2020-01-24 VITALS — BP 132/80 | HR 65 | Ht 75.5 in | Wt 316.0 lb

## 2020-01-24 DIAGNOSIS — I10 Essential (primary) hypertension: Secondary | ICD-10-CM | POA: Diagnosis not present

## 2020-01-24 NOTE — Patient Instructions (Signed)
Mediterranean Diet A Mediterranean diet refers to food and lifestyle choices that are based on the traditions of countries located on the [employer]. This way of eating has been shown to help prevent certain conditions and improve outcomes for people who have chronic diseases, like kidney disease and heart disease. What are tips for following this plan? Lifestyle  Cook and eat meals together with your family, when possible.  Drink enough fluid to keep your urine clear or pale yellow.  Be physically active every day. This includes: ? Aerobic exercise like running or swimming. ? Leisure activities like gardening, walking, or housework.  Get 7-8 hours of sleep each night.  If recommended by your health care provider, drink red wine in moderation. This means 1 glass a day for nonpregnant women and 2 glasses a day for men. A glass of wine equals 5 oz (150 mL). Reading food labels   Check the serving size of packaged foods. For foods such as rice and pasta, the serving size refers to the amount of cooked product, not dry.  Check the total fat in packaged foods. Avoid foods that have saturated fat or trans fats.  Check the ingredients list for added sugars, such as corn syrup. Shopping  At the grocery store, buy most of your food from the areas near the walls of the store. This includes: ? Fresh fruits and vegetables (produce). ? Grains, beans, nuts, and seeds. Some of these may be available in unpackaged forms or large amounts (in bulk). ? Fresh seafood. ? Poultry and eggs. ? Low-fat dairy products.  Buy whole ingredients instead of prepackaged foods.  Buy fresh fruits and vegetables in-season from local farmers markets.  Buy frozen fruits and vegetables in resealable bags.  If you do not have access to quality fresh seafood, buy precooked frozen shrimp or canned fish, such as tuna, salmon, or sardines.  Buy small amounts of raw or cooked vegetables, salads, or olives from  the deli or salad bar at your store.  Stock your pantry so you always have certain foods on hand, such as olive oil, canned tuna, canned tomatoes, rice, pasta, and beans. Cooking  Cook foods with extra-virgin olive oil instead of using butter or other vegetable oils.  Have meat as a side dish, and have vegetables or grains as your main dish. This means having meat in small portions or adding small amounts of meat to foods like pasta or stew.  Use beans or vegetables instead of meat in common dishes like chili or lasagna.  Experiment with different cooking methods. Try roasting or broiling vegetables instead of steaming or sauteing them.  Add frozen vegetables to soups, stews, pasta, or rice.  Add nuts or seeds for added healthy fat at each meal. You can add these to yogurt, salads, or vegetable dishes.  Marinate fish or vegetables using olive oil, lemon juice, garlic, and fresh herbs. Meal planning   Plan to eat 1 vegetarian meal one day each week. Try to work up to 2 vegetarian meals, if possible.  Eat seafood 2 or more times a week.  Have healthy snacks readily available, such as: ? Vegetable sticks with hummus. ? [place] yogurt. ? Fruit and nut trail mix.  Eat balanced meals throughout the week. This includes: ? Fruit: 2-3 servings a day ? Vegetables: 4-5 servings a day ? Low-fat dairy: 2 servings a day ? Fish, poultry, or lean meat: 1 serving a day ? Beans and legumes: 2 or more servings a week ?  Nuts and seeds: 1-2 servings a day ? Whole grains: 6-8 servings a day ? Extra-virgin olive oil: 3-4 servings a day  Limit red meat and sweets to only a few servings a month What are my food choices?  Mediterranean diet ? Recommended  Grains: Whole-grain pasta. Brown rice. Bulgar wheat. Polenta. Couscous. Whole-wheat bread. [place].  Vegetables: Artichokes. Beets. Broccoli. Cabbage. Carrots. Eggplant. Green beans. Chard. Kale. Spinach. Onions. Leeks. Peas. Squash.  Tomatoes. Peppers. Radishes.  Fruits: Apples. Apricots. Avocado. Berries. Bananas. Cherries. Dates. Figs. Grapes. Lemons. Melon. Oranges. Peaches. Plums. Pomegranate.  Meats and other protein foods: Beans. Almonds. Sunflower seeds. Pine nuts. Peanuts. [place]. Salmon. Scallops. Shrimp. [place]. Tilapia. Clams. Oysters. Eggs.  Dairy: Low-fat milk. Cheese. Greek yogurt.  Beverages: Water. Red wine. Herbal tea.  Fats and oils: Extra virgin olive oil. Avocado oil. Grape seed oil.  Sweets and desserts: [place] yogurt with honey. Baked apples. Poached pears. Trail mix.  Seasoning and other foods: Basil. Cilantro. Coriander. Cumin. Mint. Parsley. Sage. Rosemary. Tarragon. Garlic. Oregano. Thyme. Pepper. Balsalmic vinegar. Tahini. Hummus. Tomato sauce. Olives. Mushrooms. ? Limit these  Grains: Prepackaged pasta or rice dishes. Prepackaged cereal with added sugar.  Vegetables: Deep fried potatoes (french fries).  Fruits: Fruit canned in syrup.  Meats and other protein foods: Beef. Pork. Lamb. Poultry with skin. Hot dogs. Berniece Salines.  Dairy: Ice cream. Sour cream. Whole milk.  Beverages: Juice. Sugar-sweetened soft drinks. Beer. Liquor and spirits.  Fats and oils: Butter. Canola oil. Vegetable oil. Beef fat (tallow). Lard.  Sweets and desserts: Cookies. Cakes. Pies. Candy.  Seasoning and other foods: Mayonnaise. Premade sauces and marinades. The items listed may not be a complete list. Talk with your dietitian about what dietary choices are right for you. Summary  The Mediterranean diet includes both food and lifestyle choices.  Eat a variety of fresh fruits and vegetables, beans, nuts, seeds, and whole grains.  Limit the amount of red meat and sweets that you eat.  Talk with your health care provider about whether it is safe for you to drink red wine in moderation. This means 1 glass a day for nonpregnant women and 2 glasses a day for men. A glass of wine equals 5 oz (150 mL). This information  is not intended to replace advice given to you by your health care provider. Make sure you discuss any questions you have with your health care provider. Document Revised: 10/04/2015 Document Reviewed: 09/27/2015 Elsevier Patient Education  [place] DASH stands for "Dietary Approaches to Stop Hypertension." The DASH eating plan is a healthy eating plan that has been shown to reduce high blood pressure (hypertension). It may also reduce your risk for type 2 diabetes, heart disease, and stroke. The DASH eating plan may also help with weight loss. What are tips for following this plan?  General guidelines  Avoid eating more than 2,300 mg (milligrams) of salt (sodium) a day. If you have hypertension, you may need to reduce your sodium intake to 1,500 mg a day.  Limit alcohol intake to no more than 1 drink a day for nonpregnant women and 2 drinks a day for men. One drink equals 12 oz of beer, 5 oz of wine, or 1 oz of hard liquor.  Work with your health care provider to maintain a healthy body weight or to lose weight. Ask what an ideal weight is for you.  Get at least 30 minutes of exercise that causes your heart to beat faster (aerobic exercise)  most days of the week. Activities may include walking, swimming, or biking.  Work with your health care provider or diet and nutrition specialist (dietitian) to adjust your eating plan to your individual calorie needs. Reading food labels   Check food labels for the amount of sodium per serving. Choose foods with less than 5 percent of the Daily Value of sodium. Generally, foods with less than 300 mg of sodium per serving fit into this eating plan.  To find whole grains, look for the word "whole" as the first word in the ingredient list. Shopping  Buy products labeled as "low-sodium" or "no salt added."  Buy fresh foods. Avoid canned foods and premade or frozen meals. Cooking  Avoid adding salt when cooking. Use  salt-free seasonings or herbs instead of table salt or sea salt. Check with your health care provider or pharmacist before using salt substitutes.  Do not fry foods. Cook foods using healthy methods such as baking, boiling, grilling, and broiling instead.  Cook with heart-healthy oils, such as olive, canola, soybean, or sunflower oil. Meal planning  Eat a balanced diet that includes: ? 5 or more servings of fruits and vegetables each day. At each meal, try to fill half of your plate with fruits and vegetables. ? Up to 6-8 servings of whole grains each day. ? Less than 6 oz of lean meat, poultry, or fish each day. A 3-oz serving of meat is about the same size as a deck of cards. One egg equals 1 oz. ? 2 servings of low-fat dairy each day. ? A serving of nuts, seeds, or beans 5 times each week. ? Heart-healthy fats. Healthy fats called Omega-3 fatty acids are found in foods such as flaxseeds and coldwater fish, like sardines, salmon, and mackerel.  Limit how much you eat of the following: ? Canned or prepackaged foods. ? Food that is high in trans fat, such as fried foods. ? Food that is high in saturated fat, such as fatty meat. ? Sweets, desserts, sugary drinks, and other foods with added sugar. ? Full-fat dairy products.  Do not salt foods before eating.  Try to eat at least 2 vegetarian meals each week.  Eat more home-cooked food and less restaurant, buffet, and fast food.  When eating at a restaurant, ask that your food be prepared with less salt or no salt, if possible. What foods are recommended? The items listed may not be a complete list. Talk with your dietitian about what dietary choices are best for you. Grains Whole-grain or whole-wheat bread. Whole-grain or whole-wheat pasta. Brown rice. [place]. Bulgur. Whole-grain and low-sodium cereals. Pita bread. Low-fat, low-sodium crackers. Whole-wheat flour tortillas. Vegetables Fresh or frozen vegetables (raw, steamed,  roasted, or grilled). Low-sodium or reduced-sodium tomato and vegetable juice. Low-sodium or reduced-sodium tomato sauce and tomato paste. Low-sodium or reduced-sodium canned vegetables. Fruits All fresh, dried, or frozen fruit. Canned fruit in natural juice (without added sugar). Meat and other protein foods Skinless chicken or [place]. Ground chicken or [place]. Pork with fat trimmed off. Fish and seafood. Egg whites. Dried beans, peas, or lentils. Unsalted nuts, nut butters, and seeds. Unsalted canned beans. Lean cuts of beef with fat trimmed off. Low-sodium, lean deli meat. Dairy Low-fat (1%) or fat-free (skim) milk. Fat-free, low-fat, or reduced-fat cheeses. Nonfat, low-sodium ricotta or cottage cheese. Low-fat or nonfat yogurt. Low-fat, low-sodium cheese. Fats and oils Soft margarine without trans fats. Vegetable oil. Low-fat, reduced-fat, or light mayonnaise and salad dressings (reduced-sodium). Canola, safflower, olive,  soybean, and sunflower oils. Avocado. Seasoning and other foods Herbs. Spices. Seasoning mixes without salt. Unsalted popcorn and pretzels. Fat-free sweets. What foods are not recommended? The items listed may not be a complete list. Talk with your dietitian about what dietary choices are best for you. Grains Baked goods made with fat, such as croissants, muffins, or some breads. Dry pasta or rice meal packs. Vegetables Creamed or fried vegetables. Vegetables in a cheese sauce. Regular canned vegetables (not low-sodium or reduced-sodium). Regular canned tomato sauce and paste (not low-sodium or reduced-sodium). Regular tomato and vegetable juice (not low-sodium or reduced-sodium). Angie Fava. Olives. Fruits Canned fruit in a light or heavy syrup. Fried fruit. Fruit in cream or butter sauce. Meat and other protein foods Fatty cuts of meat. Ribs. Fried meat. Berniece Salines. Sausage. Bologna and other processed lunch meats. Salami. Fatback. Hotdogs. Bratwurst. Salted nuts and seeds. Canned  beans with added salt. Canned or smoked fish. Whole eggs or egg yolks. Chicken or [place] with skin. Dairy Whole or 2% milk, cream, and half-and-half. Whole or full-fat cream cheese. Whole-fat or sweetened yogurt. Full-fat cheese. Nondairy creamers. Whipped toppings. Processed cheese and cheese spreads. Fats and oils Butter. Stick margarine. Lard. Shortening. Ghee. Bacon fat. Tropical oils, such as coconut, palm kernel, or palm oil. Seasoning and other foods Salted popcorn and pretzels. Onion salt, garlic salt, seasoned salt, table salt, and sea salt. Worcestershire sauce. Tartar sauce. Barbecue sauce. Teriyaki sauce. Soy sauce, including reduced-sodium. Steak sauce. Canned and packaged gravies. Fish sauce. Oyster sauce. Cocktail sauce. Horseradish that you find on the shelf. Ketchup. Mustard. Meat flavorings and tenderizers. Bouillon cubes. Hot sauce and Tabasco sauce. Premade or packaged marinades. Premade or packaged taco seasonings. Relishes. Regular salad dressings. Where to find more information:  National Heart, Lung, and [place]: https://wilson-eaton.com/  American Heart Association: www.heart.org Summary  The DASH eating plan is a healthy eating plan that has been shown to reduce high blood pressure (hypertension). It may also reduce your risk for type 2 diabetes, heart disease, and stroke.  With the DASH eating plan, you should limit salt (sodium) intake to 2,300 mg a day. If you have hypertension, you may need to reduce your sodium intake to 1,500 mg a day.  When on the DASH eating plan, aim to eat more fresh fruits and vegetables, whole grains, lean proteins, low-fat dairy, and heart-healthy fats.  Work with your health care provider or diet and nutrition specialist (dietitian) to adjust your eating plan to your individual calorie needs. This information is not intended to replace advice given to you by your health care provider. Make sure you discuss any questions you have with your  health care provider. Document Revised: 01/16/2017 Document Reviewed: 01/28/2016 Elsevier Patient Education  2020 [employer].

## 2020-01-24 NOTE — Progress Notes (Signed)
Established Patient Office Visit  Subjective:  Patient ID: Eric Mann, male    DOB: [birth year]  Age: 59 y.o. MRN: [id number]  CC:  Chief Complaint  Patient presents with  . Hypertension    HPI Eric Mann presents for follow-up hypertension.  He has been on amlodipine and last visit we obtained a reading of 160/100.  We added losartan HCTZ and he is tolerating well without difficulty.  He has not been monitoring blood pressures regularly.  No headaches or dizziness.  Not getting any consistent exercise.  No regular alcohol use.  Past Medical History:  Diagnosis Date  . Arthritis    lt. knee  . Glaucoma    not on medicine yet  . Gout   . Gout   . Hyperlipidemia   . Hypertension     Past Surgical History:  Procedure Laterality Date  . COLONOSCOPY    . KNEE ARTHROSCOPY Left 06/24/2017  . POLYPECTOMY      Family History  Problem Relation Age of Onset  . Heart disease Mother   . Hypertension Mother   . Diabetes Mother   . Diabetes Sister   . Cancer Sister        cervix cancer  . Diabetes Brother   . Colon cancer Neg Hx   . Esophageal cancer Neg Hx   . Rectal cancer Neg Hx   . Stomach cancer Neg Hx   . Colon polyps Neg Hx     Social History   Socioeconomic History  . Marital status: Married    Spouse name: Not on file  . Number of children: Not on file  . Years of education: Not on file  . Highest education level: Not on file  Occupational History  . Not on file  Tobacco Use  . Smoking status: Never Smoker  . Smokeless tobacco: Never Used  Vaping Use  . Vaping Use: Never used  Substance and Sexual Activity  . Alcohol use: No    Alcohol/week: 0.0 standard drinks    Comment: wine occasionally  . Drug use: No  . Sexual activity: Not on file  Other Topics Concern  . Not on file  Social History Narrative  . Not on file   Social Determinants of Health   Financial Resource Strain:   . Difficulty of Paying Living Expenses: Not on file  Food  Insecurity:   . Worried About Charity fundraiser in the Last Year: Not on file  . Ran Out of Food in the Last Year: Not on file  Transportation Needs:   . Lack of Transportation (Medical): Not on file  . Lack of Transportation (Non-Medical): Not on file  Physical Activity:   . Days of Exercise per Week: Not on file  . Minutes of Exercise per Session: Not on file  Stress:   . Feeling of Stress : Not on file  Social Connections:   . Frequency of Communication with Friends and Family: Not on file  . Frequency of Social Gatherings with Friends and Family: Not on file  . Attends Religious Services: Not on file  . Active Member of Clubs or Organizations: Not on file  . Attends Archivist Meetings: Not on file  . Marital Status: Not on file  Intimate Partner Violence:   . Fear of Current or Ex-Partner: Not on file  . Emotionally Abused: Not on file  . Physically Abused: Not on file  . Sexually Abused: Not on file  Outpatient Medications Prior to Visit  Medication Sig Dispense Refill  . allopurinol (ZYLOPRIM) 300 MG tablet Take 1 tablet (300 mg total) by mouth daily. 90 tablet 3  . amLODipine (NORVASC) 5 MG tablet TAKE 1 TABLET(5 MG) BY MOUTH DAILY 90 tablet 3  . atorvastatin (LIPITOR) 40 MG tablet Take 1 tablet (40 mg total) by mouth daily. (Patient not taking: Reported on 12/19/2019) 90 tablet 1  . ciclopirox (LOPROX) 0.77 % cream Apply topically 2 (two) times daily. 30 g 1  . colchicine (COLCRYS) 0.6 MG tablet Take 1 tablet (0.6 mg total) by mouth daily. 60 tablet 1  . losartan-hydrochlorothiazide (HYZAAR) 50-12.5 MG tablet Take 1 tablet by mouth daily. 90 tablet 3  . naproxen (NAPROSYN) 500 MG tablet Take 1 tablet (500 mg total) by mouth 2 (two) times daily. (Patient not taking: Reported on 12/19/2019) 60 tablet 0  . sildenafil (VIAGRA) 100 MG tablet Take 1 tablet (100 mg total) by mouth daily as needed for erectile dysfunction. 6 tablet 11   Facility-Administered  Medications Prior to Visit  Medication Dose Route Frequency Provider Last Rate Last Admin  . 0.9 %  sodium chloride infusion  500 mL Intravenous Once Irene Shipper, MD      . methylPREDNISolone acetate (DEPO-MEDROL) injection 80 mg  80 mg Intramuscular Once McKeag, Marylynn Pearson, MD        No Known Allergies  ROS Review of Systems  Constitutional: Negative for fatigue and unexpected weight change.  Eyes: Negative for visual disturbance.  Respiratory: Negative for cough, chest tightness and shortness of breath.   Cardiovascular: Negative for chest pain, palpitations and leg swelling.  Neurological: Negative for dizziness, syncope, weakness, light-headedness and headaches.      Objective:    Physical Exam Vitals reviewed.  Constitutional:      Appearance: Normal appearance.  Cardiovascular:     Rate and Rhythm: Normal rate and regular rhythm.  Pulmonary:     Effort: Pulmonary effort is normal.     Breath sounds: Normal breath sounds.  Musculoskeletal:     Right lower leg: No edema.     Left lower leg: No edema.  Neurological:     Mental Status: He is alert.     BP 132/80   Pulse 65   Ht 6' 3.5" (1.918 m)   Wt (!) 316 lb (143.3 kg)   BMI 38.98 kg/m  Wt Readings from Last 3 Encounters:  01/24/20 (!) 316 lb (143.3 kg)  12/19/19 (!) 317 lb 11.2 oz (144.1 kg)  09/16/19 (!) 317 lb 3.2 oz (143.9 kg)     Health Maintenance Due  Topic Date Due  . COVID-19 Vaccine (1) Never done    There are no preventive care reminders to display for this patient.  Lab Results  Component Value Date   TSH 1.01 10/08/2018   Lab Results  Component Value Date   WBC 4.2 10/08/2018   HGB 12.9 (L) 10/08/2018   HCT 38.8 (L) 10/08/2018   MCV 87.0 10/08/2018   PLT 177.0 10/08/2018   Lab Results  Component Value Date   NA 141 10/08/2018   K 3.5 10/08/2018   CO2 33 (H) 10/08/2018   GLUCOSE 94 10/08/2018   BUN 17 10/08/2018   CREATININE 1.02 10/08/2018   BILITOT 0.5 10/08/2018   ALKPHOS  80 10/08/2018   AST 22 10/08/2018   ALT 22 10/08/2018   PROT 6.8 10/08/2018   ALBUMIN 4.1 10/08/2018   CALCIUM 8.7 10/08/2018   GFR 90.72  10/08/2018   Lab Results  Component Value Date   CHOL 201 (H) 01/12/2019   Lab Results  Component Value Date   HDL 47.40 01/12/2019   Lab Results  Component Value Date   LDLCALC 138 (H) 01/12/2019   Lab Results  Component Value Date   TRIG 77.0 01/12/2019   Lab Results  Component Value Date   CHOLHDL 4 01/12/2019   No results found for: HGBA1C    Assessment & Plan:   Problem List Items Addressed This Visit      Unprioritized   Essential hypertension, benign - Primary   Relevant Orders   Basic metabolic panel    Blood pressure greatly improved today compared to last visit. -Continue combination therapy with amlodipine and losartan HCTZ -Check basic metabolic panel -He is encouraged to try to lose some weight and establish more consistent exercise -Routine follow-up in 6 months and sooner as needed  No orders of the defined types were placed in this encounter.   Follow-up: Return in about 6 months (around 07/24/2020).    Carolann Littler, MD

## 2020-01-25 LAB — BASIC METABOLIC PANEL
BUN: 18 mg/dL (ref 7–25)
CO2: 30 mmol/L (ref 20–32)
Calcium: 9.2 mg/dL (ref 8.6–10.3)
Chloride: 104 mmol/L (ref 98–110)
Creat: 1.11 mg/dL (ref 0.70–1.33)
Glucose, Bld: 82 mg/dL (ref 65–99)
Potassium: 4 mmol/L (ref 3.5–5.3)
Sodium: 143 mmol/L (ref 135–146)

## 2020-01-25 NOTE — Progress Notes (Signed)
Renal function and electrolytes are normal.

## 2020-07-03 ENCOUNTER — Encounter: Payer: Self-pay | Admitting: Internal Medicine

## 2020-09-30 ENCOUNTER — Other Ambulatory Visit: Payer: Self-pay | Admitting: Family Medicine

## 2020-12-05 ENCOUNTER — Ambulatory Visit (INDEPENDENT_AMBULATORY_CARE_PROVIDER_SITE_OTHER): Payer: 59 | Admitting: Family Medicine

## 2020-12-05 ENCOUNTER — Other Ambulatory Visit: Payer: Self-pay

## 2020-12-05 VITALS — BP 158/90 | HR 59 | Temp 97.8°F | Wt 316.5 lb

## 2020-12-05 DIAGNOSIS — I10 Essential (primary) hypertension: Secondary | ICD-10-CM | POA: Diagnosis not present

## 2020-12-05 DIAGNOSIS — R519 Headache, unspecified: Secondary | ICD-10-CM

## 2020-12-05 NOTE — Patient Instructions (Signed)
Increase the Amlodipine to 10 mg once daily  Try to keep daily sodium < 2,500 mg  Let me know if headache not improving in next couple of weeks.

## 2020-12-05 NOTE — Progress Notes (Signed)
Established Patient Office Visit  Subjective:  Patient ID: Eric Mann, male    DOB: [birth year]  Age: 60 y.o. MRN: [id number]  CC:  Chief Complaint  Patient presents with   Follow-up    Hypertension, BP was 150/104 this morning.     HPI Eric Mann presents for recent increase and bifrontal headaches and elevated blood pressure.  He was assessed at work couple days ago and had reading 140/80.  He takes amlodipine 5 mg daily and losartan HCTZ 50/12.5 mg daily.  No alcohol use.  Tries to watch sodium intake closely.  Does have some muscle tension occasionally in his neck.  No vomiting.  No visual changes.  No regular nonsteroidal use.  Past Medical History:  Diagnosis Date   Arthritis    lt. knee   Glaucoma    not on medicine yet   Gout    Gout    Hyperlipidemia    Hypertension     Past Surgical History:  Procedure Laterality Date   COLONOSCOPY     KNEE ARTHROSCOPY Left 06/24/2017   POLYPECTOMY      Family History  Problem Relation Age of Onset   Heart disease Mother    Hypertension Mother    Diabetes Mother    Diabetes Sister    Cancer Sister        cervix cancer   Diabetes Brother    Colon cancer Neg Hx    Esophageal cancer Neg Hx    Rectal cancer Neg Hx    Stomach cancer Neg Hx    Colon polyps Neg Hx     Social History   Socioeconomic History   Marital status: Married    Spouse name: Not on file   Number of children: Not on file   Years of education: Not on file   Highest education level: Not on file  Occupational History   Not on file  Tobacco Use   Smoking status: Never   Smokeless tobacco: Never  Vaping Use   Vaping Use: Never used  Substance and Sexual Activity   Alcohol use: No    Alcohol/week: 0.0 standard drinks    Comment: wine occasionally   Drug use: No   Sexual activity: Not on file  Other Topics Concern   Not on file  Social History Narrative   Not on file   Social Determinants of Health   Financial Resource Strain: Not  on file  Food Insecurity: Not on file  Transportation Needs: Not on file  Physical Activity: Not on file  Stress: Not on file  Social Connections: Not on file  Intimate Partner Violence: Not on file    Outpatient Medications Prior to Visit  Medication Sig Dispense Refill   allopurinol (ZYLOPRIM) 300 MG tablet TAKE 1 TABLET(300 MG) BY MOUTH DAILY 90 tablet 0   amLODipine (NORVASC) 5 MG tablet TAKE 1 TABLET(5 MG) BY MOUTH DAILY (Patient taking differently: 10 mg. TAKE 2 TABLET(5 MG) BY MOUTH DAILY) 90 tablet 0   atorvastatin (LIPITOR) 40 MG tablet Take 1 tablet (40 mg total) by mouth daily. 90 tablet 1   ciclopirox (LOPROX) 0.77 % cream Apply topically 2 (two) times daily. 30 g 1   colchicine (COLCRYS) 0.6 MG tablet Take 1 tablet (0.6 mg total) by mouth daily. 60 tablet 1   losartan-hydrochlorothiazide (HYZAAR) 50-12.5 MG tablet Take 1 tablet by mouth daily. 90 tablet 3   naproxen (NAPROSYN) 500 MG tablet Take 1 tablet (500 mg total) by mouth  2 (two) times daily. 60 tablet 0   sildenafil (VIAGRA) 100 MG tablet Take 1 tablet (100 mg total) by mouth daily as needed for erectile dysfunction. 6 tablet 11   Facility-Administered Medications Prior to Visit  Medication Dose Route Frequency Provider Last Rate Last Admin   0.9 %  sodium chloride infusion  500 mL Intravenous Once Irene Shipper, MD       methylPREDNISolone acetate (DEPO-MEDROL) injection 80 mg  80 mg Intramuscular Once McKeag, Marylynn Pearson, MD        No Known Allergies  ROS Review of Systems  Constitutional:  Negative for fatigue and unexpected weight change.  Eyes:  Negative for visual disturbance.  Respiratory:  Negative for cough, chest tightness and shortness of breath.   Cardiovascular:  Negative for chest pain, palpitations and leg swelling.  Neurological:  Positive for headaches. Negative for dizziness, syncope, speech difficulty, weakness and light-headedness.     Objective:    Physical Exam Constitutional:       Appearance: He is well-developed.  HENT:     Right Ear: External ear normal.     Left Ear: External ear normal.  Eyes:     Extraocular Movements: Extraocular movements intact.     Pupils: Pupils are equal, round, and reactive to light.  Neck:     Thyroid: No thyromegaly.  Cardiovascular:     Rate and Rhythm: Normal rate and regular rhythm.  Pulmonary:     Effort: Pulmonary effort is normal. No respiratory distress.     Breath sounds: Normal breath sounds. No wheezing or rales.  Musculoskeletal:     Cervical back: Neck supple.     Right lower leg: No edema.     Left lower leg: No edema.  Neurological:     Mental Status: He is alert and oriented to person, place, and time.    BP (!) 158/90 (BP Location: Left Arm, Cuff Size: Large)   Pulse (!) 59   Temp 97.8 F (36.6 C) (Oral)   Wt (!) 316 lb 8 oz (143.6 kg)   SpO2 99%   BMI 39.04 kg/m  Wt Readings from Last 3 Encounters:  12/05/20 (!) 316 lb 8 oz (143.6 kg)  01/24/20 (!) 316 lb (143.3 kg)  12/19/19 (!) 317 lb 11.2 oz (144.1 kg)     Health Maintenance Due  Topic Date Due   COVID-19 Vaccine (1) Never done   Zoster Vaccines- Shingrix (1 of 2) Never done   COLONOSCOPY (Pts 45-25yrs Insurance coverage will need to be confirmed)  07/07/2020   INFLUENZA VACCINE  09/17/2020    There are no preventive care reminders to display for this patient.  Lab Results  Component Value Date   TSH 1.01 10/08/2018   Lab Results  Component Value Date   WBC 4.2 10/08/2018   HGB 12.9 (L) 10/08/2018   HCT 38.8 (L) 10/08/2018   MCV 87.0 10/08/2018   PLT 177.0 10/08/2018   Lab Results  Component Value Date   NA 143 01/24/2020   K 4.0 01/24/2020   CO2 30 01/24/2020   GLUCOSE 82 01/24/2020   BUN 18 01/24/2020   CREATININE 1.11 01/24/2020   BILITOT 0.5 10/08/2018   ALKPHOS 80 10/08/2018   AST 22 10/08/2018   ALT 22 10/08/2018   PROT 6.8 10/08/2018   ALBUMIN 4.1 10/08/2018   CALCIUM 9.2 01/24/2020   GFR 90.72 10/08/2018   Lab  Results  Component Value Date   CHOL 201 (H) 01/12/2019   Lab  Results  Component Value Date   HDL 47.40 01/12/2019   Lab Results  Component Value Date   LDLCALC 138 (H) 01/12/2019   Lab Results  Component Value Date   TRIG 77.0 01/12/2019   Lab Results  Component Value Date   CHOLHDL 4 01/12/2019   No results found for: HGBA1C    Assessment & Plan:   #1 hypertension poorly controlled.  He had some recent headaches which may be related to this.  -We recommend he try to lose some weight -Try to keep sodium intake less than 2400 mg daily -Increase amlodipine to 10 mg daily and watch for peripheral edema -Continue losartan HCTZ and may need to increase dose soon -Set up 3-week follow-up to reassess  #2 bilateral headaches.  Suspect this may be related to #1 above.  If not, suspect tension type headaches.  Avoid daily use of analgesics.  If headaches not improving with better blood pressure control consider other therapy such as possible low-dose muscle relaxer or even possibly low-dose TCA Follow-up promptly for any worsening headache or change in headache pattern.   No orders of the defined types were placed in this encounter.   Follow-up: Return in about 3 weeks (around 12/26/2020).    Carolann Littler, MD

## 2020-12-26 ENCOUNTER — Ambulatory Visit (INDEPENDENT_AMBULATORY_CARE_PROVIDER_SITE_OTHER): Payer: 59 | Admitting: Family Medicine

## 2020-12-26 ENCOUNTER — Other Ambulatory Visit: Payer: Self-pay

## 2020-12-26 VITALS — BP 150/90 | HR 59 | Temp 98.3°F | Wt 308.5 lb

## 2020-12-26 DIAGNOSIS — I10 Essential (primary) hypertension: Secondary | ICD-10-CM

## 2020-12-26 DIAGNOSIS — Z23 Encounter for immunization: Secondary | ICD-10-CM | POA: Diagnosis not present

## 2020-12-26 MED ORDER — SPIRONOLACTONE 25 MG PO TABS: 25.0000 mg | ORAL_TABLET | Freq: Every day | ORAL | 3 refills | Status: DC

## 2020-12-26 NOTE — Progress Notes (Signed)
Established Patient Office Visit  Subjective:  Patient ID: Eric Mann, male    DOB: 07-25-1960  Age: 60 y.o. MRN: [id number]  CC:  Chief Complaint  Patient presents with   Follow-up    hypertension    HPI Eric Mann presents for follow-up hypertension.  We recently increased his amlodipine to 10 mg daily.  He remains on losartan HCTZ 50/12.5 mg daily.  Compliant with therapy.  Not monitoring blood sugars regularly at home.  No regular exercise.  No recent nonsteroidal use.  No alcohol use.  Tries to watch sodium intake carefully.  Does not eat out a lot.  No recent chest pains, peripheral edema, or dizziness.  Past Medical History:  Diagnosis Date   Arthritis    lt. knee   Glaucoma    not on medicine yet   Gout    Gout    Hyperlipidemia    Hypertension     Past Surgical History:  Procedure Laterality Date   COLONOSCOPY     KNEE ARTHROSCOPY Left 06/24/2017   POLYPECTOMY      Family History  Problem Relation Age of Onset   Heart disease Mother    Hypertension Mother    Diabetes Mother    Diabetes Sister    Cancer Sister        cervix cancer   Diabetes Brother    Colon cancer Neg Hx    Esophageal cancer Neg Hx    Rectal cancer Neg Hx    Stomach cancer Neg Hx    Colon polyps Neg Hx     Social History   Socioeconomic History   Marital status: Married    Spouse name: Not on file   Number of children: Not on file   Years of education: Not on file   Highest education level: Not on file  Occupational History   Not on file  Tobacco Use   Smoking status: Never   Smokeless tobacco: Never  Vaping Use   Vaping Use: Never used  Substance and Sexual Activity   Alcohol use: No    Alcohol/week: 0.0 standard drinks    Comment: wine occasionally   Drug use: No   Sexual activity: Not on file  Other Topics Concern   Not on file  Social History Narrative   Not on file   Social Determinants of Health   Financial Resource Strain: Not on file  Food  Insecurity: Not on file  Transportation Needs: Not on file  Physical Activity: Not on file  Stress: Not on file  Social Connections: Not on file  Intimate Partner Violence: Not on file    Outpatient Medications Prior to Visit  Medication Sig Dispense Refill   allopurinol (ZYLOPRIM) 300 MG tablet TAKE 1 TABLET(300 MG) BY MOUTH DAILY 90 tablet 0   amLODipine (NORVASC) 5 MG tablet TAKE 1 TABLET(5 MG) BY MOUTH DAILY (Patient taking differently: 10 mg. TAKE 2 TABLET(5 MG) BY MOUTH DAILY) 90 tablet 0   atorvastatin (LIPITOR) 40 MG tablet Take 1 tablet (40 mg total) by mouth daily. 90 tablet 1   ciclopirox (LOPROX) 0.77 % cream Apply topically 2 (two) times daily. 30 g 1   colchicine (COLCRYS) 0.6 MG tablet Take 1 tablet (0.6 mg total) by mouth daily. 60 tablet 1   losartan-hydrochlorothiazide (HYZAAR) 50-12.5 MG tablet Take 1 tablet by mouth daily. 90 tablet 3   naproxen (NAPROSYN) 500 MG tablet Take 1 tablet (500 mg total) by mouth 2 (two) times daily. [place]  tablet 0   Facility-Administered Medications Prior to Visit  Medication Dose Route Frequency Provider Last Rate Last Admin   0.9 %  sodium chloride infusion  500 mL Intravenous Once Irene Shipper, MD       methylPREDNISolone acetate (DEPO-MEDROL) injection 80 mg  80 mg Intramuscular Once McKeag, Marylynn Pearson, MD        No Known Allergies  ROS Review of Systems  Constitutional:  Negative for fatigue.  Eyes:  Negative for visual disturbance.  Respiratory:  Negative for cough, chest tightness and shortness of breath.   Cardiovascular:  Negative for chest pain, palpitations and leg swelling.  Neurological:  Negative for dizziness, syncope, weakness, light-headedness and headaches.     Objective:    Physical Exam Vitals reviewed.  Constitutional:      Appearance: Normal appearance. He is well-developed.  Eyes:     Pupils: Pupils are equal, round, and reactive to light.  Neck:     Thyroid: No thyromegaly.  Cardiovascular:     Rate and  Rhythm: Normal rate and regular rhythm.  Pulmonary:     Effort: Pulmonary effort is normal. No respiratory distress.     Breath sounds: Normal breath sounds. No wheezing or rales.  Musculoskeletal:     Cervical back: Neck supple.     Right lower leg: No edema.     Left lower leg: No edema.  Neurological:     Mental Status: He is alert and oriented to person, place, and time.    BP (!) 160/90 (BP Location: Left Arm, Patient Position: Sitting, Cuff Size: Normal)   Pulse (!) 59   Temp 98.3 F (36.8 C) (Oral)   Wt (!) 308 lb 8 oz (139.9 kg)   SpO2 99%   BMI 38.05 kg/m  Wt Readings from Last 3 Encounters:  12/26/20 (!) 308 lb 8 oz (139.9 kg)  12/05/20 (!) 316 lb 8 oz (143.6 kg)  01/24/20 (!) 316 lb (143.3 kg)     Health Maintenance Due  Topic Date Due   COVID-19 Vaccine (1) Never done   Zoster Vaccines- Shingrix (1 of 2) Never done   COLONOSCOPY (Pts 45-26yrs Insurance coverage will need to be confirmed)  07/07/2020   INFLUENZA VACCINE  09/17/2020    There are no preventive care reminders to display for this patient.  Lab Results  Component Value Date   TSH 1.01 10/08/2018   Lab Results  Component Value Date   WBC 4.2 10/08/2018   HGB 12.9 (L) 10/08/2018   HCT 38.8 (L) 10/08/2018   MCV 87.0 10/08/2018   PLT 177.0 10/08/2018   Lab Results  Component Value Date   NA 143 01/24/2020   K 4.0 01/24/2020   CO2 30 01/24/2020   GLUCOSE 82 01/24/2020   BUN 18 01/24/2020   CREATININE 1.11 01/24/2020   BILITOT 0.5 10/08/2018   ALKPHOS 80 10/08/2018   AST 22 10/08/2018   ALT 22 10/08/2018   PROT 6.8 10/08/2018   ALBUMIN 4.1 10/08/2018   CALCIUM 9.2 01/24/2020   GFR 90.72 10/08/2018   Lab Results  Component Value Date   CHOL 201 (H) 01/12/2019   Lab Results  Component Value Date   HDL 47.40 01/12/2019   Lab Results  Component Value Date   LDLCALC 138 (H) 01/12/2019   Lab Results  Component Value Date   TRIG 77.0 01/12/2019   Lab Results  Component Value  Date   CHOLHDL 4 01/12/2019   No results found for: HGBA1C  Assessment & Plan:   #1 essential hypertension.  Remains suboptimally controlled.  Repeat left arm seated after rest 150/90  -Strongly encouraged him to try to lose some weight -Establish more consistent exercise patterns especially with aerobic activity -Avoid non-steroidals -Keep sodium intake less than 2500 mg daily -Handout on DASH diet given -Add aldosterone antagonist with Aldactone 25 mg once daily -Recheck basic metabolic panel at 1 month follow-up -If not to goal at that point consider discontinuation of HCTZ and use chlorthalidone and also might consider increasing his losartan dose at that point   Meds ordered this encounter  Medications   spironolactone (ALDACTONE) 25 MG tablet    Sig: Take 1 tablet (25 mg total) by mouth daily.    Dispense:  30 tablet    Refill:  3    Follow-up: Return in about 1 month (around 01/25/2021).    Carolann Littler, MD

## 2021-01-05 ENCOUNTER — Other Ambulatory Visit: Payer: Self-pay | Admitting: Family Medicine

## 2021-01-25 ENCOUNTER — Ambulatory Visit (INDEPENDENT_AMBULATORY_CARE_PROVIDER_SITE_OTHER): Payer: 59 | Admitting: Family Medicine

## 2021-01-25 VITALS — BP 136/74 | HR 65 | Temp 98.0°F | Wt 313.2 lb

## 2021-01-25 DIAGNOSIS — R079 Chest pain, unspecified: Secondary | ICD-10-CM

## 2021-01-25 DIAGNOSIS — I10 Essential (primary) hypertension: Secondary | ICD-10-CM

## 2021-01-25 NOTE — Progress Notes (Signed)
Established Patient Office Visit  Subjective:  Patient ID: Eric Mann, male    DOB: 16-May-1960  Age: 60 y.o. MRN: [id number]  CC:  Chief Complaint  Patient presents with   Follow-up    HPI Eric Mann presents for hypertension follow-up.  He takes losartan HCTZ and amlodipine and had recent blood pressure here of 150/90.  We initiated Aldactone 25 mg daily and blood pressures are significantly improved today.  He has not lost any weight yet.  We also discussed nonpharmacologic management in some detail last visit.  He had recent ER visit for chest pain.  This was slightly right-sided.  Somewhat intermittent and worse with movement such as leaning forward or twisting certain ways.  He had work-up which included chest x-ray, EKG, multiple labs.  Chemistries were stable.  Cardiac enzymes were negative.  Denies any cough.  No fever.  He was prescribed muscle relaxer which has not helped.  No pleuritic pain.  D-dimer and CBC unremarkable.  Denies any history of injury.  Recent ER notes, chest x-ray, labs reviewed  Past Medical History:  Diagnosis Date   Arthritis    lt. knee   Glaucoma    not on medicine yet   Gout    Gout    Hyperlipidemia    Hypertension     Past Surgical History:  Procedure Laterality Date   COLONOSCOPY     KNEE ARTHROSCOPY Left 06/24/2017   POLYPECTOMY      Family History  Problem Relation Age of Onset   Heart disease Mother    Hypertension Mother    Diabetes Mother    Diabetes Sister    Cancer Sister        cervix cancer   Diabetes Brother    Colon cancer Neg Hx    Esophageal cancer Neg Hx    Rectal cancer Neg Hx    Stomach cancer Neg Hx    Colon polyps Neg Hx     Social History   Socioeconomic History   Marital status: Married    Spouse name: Not on file   Number of children: Not on file   Years of education: Not on file   Highest education level: Not on file  Occupational History   Not on file  Tobacco Use   Smoking status:  Never   Smokeless tobacco: Never  Vaping Use   Vaping Use: Never used  Substance and Sexual Activity   Alcohol use: No    Alcohol/week: 0.0 standard drinks    Comment: wine occasionally   Drug use: No   Sexual activity: Not on file  Other Topics Concern   Not on file  Social History Narrative   Not on file   Social Determinants of Health   Financial Resource Strain: Not on file  Food Insecurity: Not on file  Transportation Needs: Not on file  Physical Activity: Not on file  Stress: Not on file  Social Connections: Not on file  Intimate Partner Violence: Not on file    Outpatient Medications Prior to Visit  Medication Sig Dispense Refill   allopurinol (ZYLOPRIM) 300 MG tablet TAKE 1 TABLET(300 MG) BY MOUTH DAILY 90 tablet 0   amLODipine (NORVASC) 5 MG tablet TAKE 1 TABLET(5 MG) BY MOUTH DAILY (Patient taking differently: 10 mg. TAKE 2 TABLET(5 MG) BY MOUTH DAILY) 90 tablet 0   atorvastatin (LIPITOR) 40 MG tablet Take 1 tablet (40 mg total) by mouth daily. 90 tablet 1   ciclopirox (LOPROX) 0.77 %  cream Apply topically 2 (two) times daily. 30 g 1   colchicine (COLCRYS) 0.6 MG tablet Take 1 tablet (0.6 mg total) by mouth daily. 60 tablet 1   losartan-hydrochlorothiazide (HYZAAR) 50-12.5 MG tablet TAKE 1 TABLET BY MOUTH DAILY 90 tablet 3   naproxen (NAPROSYN) 500 MG tablet Take 1 tablet (500 mg total) by mouth 2 (two) times daily. 60 tablet 0   spironolactone (ALDACTONE) 25 MG tablet Take 1 tablet (25 mg total) by mouth daily. 30 tablet 3   Facility-Administered Medications Prior to Visit  Medication Dose Route Frequency Provider Last Rate Last Admin   0.9 %  sodium chloride infusion  500 mL Intravenous Once Irene Shipper, MD       methylPREDNISolone acetate (DEPO-MEDROL) injection 80 mg  80 mg Intramuscular Once McKeag, Marylynn Pearson, MD        No Known Allergies  ROS Review of Systems  Constitutional:  Negative for chills, fatigue and fever.  Eyes:  Negative for visual  disturbance.  Respiratory:  Negative for cough, chest tightness and shortness of breath.   Cardiovascular:  Positive for chest pain. Negative for palpitations and leg swelling.  Neurological:  Negative for dizziness, syncope, weakness, light-headedness and headaches.     Objective:    Physical Exam Constitutional:      Appearance: He is well-developed.  HENT:     Right Ear: External ear normal.     Left Ear: External ear normal.  Eyes:     Pupils: Pupils are equal, round, and reactive to light.  Neck:     Thyroid: No thyromegaly.  Cardiovascular:     Rate and Rhythm: Normal rate and regular rhythm.  Pulmonary:     Effort: Pulmonary effort is normal. No respiratory distress.     Breath sounds: Normal breath sounds. No wheezing or rales.  Musculoskeletal:     Cervical back: Neck supple.     Comments: He has some mild chest wall tenderness right costochondral junction.  Neurological:     Mental Status: He is alert and oriented to person, place, and time.    BP 136/74 (BP Location: Left Arm, Cuff Size: Large)   Pulse 65   Temp 98 F (36.7 C) (Oral)   Wt (!) 313 lb 3.2 oz (142.1 kg)   SpO2 99%   BMI 38.63 kg/m  Wt Readings from Last 3 Encounters:  01/25/21 (!) 313 lb 3.2 oz (142.1 kg)  12/26/20 (!) 308 lb 8 oz (139.9 kg)  12/05/20 (!) 316 lb 8 oz (143.6 kg)     Health Maintenance Due  Topic Date Due   COVID-19 Vaccine (1) Never done   Zoster Vaccines- Shingrix (1 of 2) Never done   COLONOSCOPY (Pts 45-66yrs Insurance coverage will need to be confirmed)  07/07/2020    There are no preventive care reminders to display for this patient.  Lab Results  Component Value Date   TSH 1.01 10/08/2018   Lab Results  Component Value Date   WBC 4.2 10/08/2018   HGB 12.9 (L) 10/08/2018   HCT 38.8 (L) 10/08/2018   MCV 87.0 10/08/2018   PLT 177.0 10/08/2018   Lab Results  Component Value Date   NA 143 01/24/2020   K 4.0 01/24/2020   CO2 30 01/24/2020   GLUCOSE 82  01/24/2020   BUN 18 01/24/2020   CREATININE 1.11 01/24/2020   BILITOT 0.5 10/08/2018   ALKPHOS 80 10/08/2018   AST 22 10/08/2018   ALT 22 10/08/2018   PROT  6.8 10/08/2018   ALBUMIN 4.1 10/08/2018   CALCIUM 9.2 01/24/2020   GFR 90.72 10/08/2018   Lab Results  Component Value Date   CHOL 201 (H) 01/12/2019   Lab Results  Component Value Date   HDL 47.40 01/12/2019   Lab Results  Component Value Date   LDLCALC 138 (H) 01/12/2019   Lab Results  Component Value Date   TRIG 77.0 01/12/2019   Lab Results  Component Value Date   CHOLHDL 4 01/12/2019   No results found for: HGBA1C    Assessment & Plan:   #1 hypertension improved with recent addition of Aldactone.  He had recent chemistries through the ER visit as above so these will not be repeated today.  We continue to emphasize lifestyle management would like him to lose weight.  Continue current regimen of losartan HCTZ, Aldactone, and amlodipine  #2 chest pain.  Somewhat atypical chest pain.  ER evaluation unremarkable.  Suspect he has some costochondritis.  Recent cardiac enzymes negative.  Recommend short-term trial of Aleve 1 to 2 tablets every 12 hours as needed.  Touch base if pain not improving over the next couple weeks  No orders of the defined types were placed in this encounter.   Follow-up: No follow-ups on file.    Carolann Littler, MD

## 2021-04-13 ENCOUNTER — Other Ambulatory Visit: Payer: Self-pay | Admitting: Family Medicine

## 2021-04-19 ENCOUNTER — Other Ambulatory Visit: Payer: Self-pay | Admitting: Family Medicine

## 2021-05-17 ENCOUNTER — Other Ambulatory Visit: Payer: Self-pay | Admitting: Family Medicine

## 2021-07-12 ENCOUNTER — Other Ambulatory Visit: Payer: Self-pay | Admitting: Family Medicine

## 2021-10-10 ENCOUNTER — Other Ambulatory Visit: Payer: Self-pay | Admitting: Family Medicine

## 2022-01-08 ENCOUNTER — Other Ambulatory Visit: Payer: Self-pay | Admitting: Family Medicine

## 2022-07-30 ENCOUNTER — Other Ambulatory Visit: Payer: Self-pay | Admitting: Family Medicine

## 2022-08-01 ENCOUNTER — Encounter: Payer: Self-pay | Admitting: Family Medicine

## 2022-08-01 ENCOUNTER — Ambulatory Visit (INDEPENDENT_AMBULATORY_CARE_PROVIDER_SITE_OTHER): Payer: 59 | Admitting: Family Medicine

## 2022-08-01 VITALS — BP 124/66 | HR 70 | Temp 98.6°F | Ht 75.5 in | Wt 323.8 lb

## 2022-08-01 DIAGNOSIS — Z23 Encounter for immunization: Secondary | ICD-10-CM

## 2022-08-01 DIAGNOSIS — Z13220 Encounter for screening for lipoid disorders: Secondary | ICD-10-CM | POA: Diagnosis not present

## 2022-08-01 DIAGNOSIS — Z125 Encounter for screening for malignant neoplasm of prostate: Secondary | ICD-10-CM | POA: Diagnosis not present

## 2022-08-01 DIAGNOSIS — Z0000 Encounter for general adult medical examination without abnormal findings: Secondary | ICD-10-CM | POA: Diagnosis not present

## 2022-08-01 DIAGNOSIS — Z1211 Encounter for screening for malignant neoplasm of colon: Secondary | ICD-10-CM

## 2022-08-01 LAB — CBC WITH DIFFERENTIAL/PLATELET
Basophils Absolute: 0 10*3/uL (ref 0.0–0.1)
Basophils Relative: 0.8 % (ref 0.0–3.0)
Eosinophils Absolute: 0.1 10*3/uL (ref 0.0–0.7)
Eosinophils Relative: 1.7 % (ref 0.0–5.0)
HCT: 43.6 % (ref 39.0–52.0)
Hemoglobin: 14.4 g/dL (ref 13.0–17.0)
Lymphocytes Relative: 33.8 % (ref 12.0–46.0)
Lymphs Abs: 1.9 10*3/uL (ref 0.7–4.0)
MCHC: 33.1 g/dL (ref 30.0–36.0)
MCV: 87.5 fl (ref 78.0–100.0)
Monocytes Absolute: 0.5 10*3/uL (ref 0.1–1.0)
Monocytes Relative: 9.3 % (ref 3.0–12.0)
Neutro Abs: 3.1 10*3/uL (ref 1.4–7.7)
Neutrophils Relative %: 54.4 % (ref 43.0–77.0)
Platelets: 223 10*3/uL (ref 150.0–400.0)
RBC: 4.98 Mil/uL (ref 4.22–5.81)
RDW: 14.6 % (ref 11.5–15.5)
WBC: 5.8 10*3/uL (ref 4.0–10.5)

## 2022-08-01 LAB — HEPATIC FUNCTION PANEL
ALT: 28 U/L (ref 0–53)
AST: 26 U/L (ref 0–37)
Albumin: 4.4 g/dL (ref 3.5–5.2)
Alkaline Phosphatase: 80 U/L (ref 39–117)
Bilirubin, Direct: 0.1 mg/dL (ref 0.0–0.3)
Total Bilirubin: 0.6 mg/dL (ref 0.2–1.2)
Total Protein: 7.6 g/dL (ref 6.0–8.3)

## 2022-08-01 LAB — LIPID PANEL
Cholesterol: 247 mg/dL — ABNORMAL HIGH (ref 0–200)
HDL: 44 mg/dL (ref >39.00)
LDL Cholesterol: 185 mg/dL — ABNORMAL HIGH (ref 0–99)
NonHDL: 202.95
Total CHOL/HDL Ratio: 6
Triglycerides: 92 mg/dL (ref 0.0–149.0)
VLDL: 18.4 mg/dL (ref 0.0–40.0)

## 2022-08-01 LAB — BASIC METABOLIC PANEL
BUN: 15 mg/dL (ref 6–23)
CO2: 30 mEq/L (ref 19–32)
Calcium: 9.2 mg/dL (ref 8.4–10.5)
Chloride: 100 mEq/L (ref 96–112)
Creatinine, Ser: 1.25 mg/dL (ref 0.40–1.50)
GFR: 61.96 mL/min (ref >60.00)
Glucose, Bld: 102 mg/dL — ABNORMAL HIGH (ref 70–99)
Potassium: 3.5 mEq/L (ref 3.5–5.1)
Sodium: 140 mEq/L (ref 135–145)

## 2022-08-01 LAB — PSA: PSA: 3.98 ng/mL (ref 0.10–4.00)

## 2022-08-01 NOTE — Progress Notes (Signed)
Established Patient Office Visit  Subjective   Patient ID: Eric Mann, male    DOB: 1960-09-29  Age: 62 y.o. MRN: [id number]  Chief Complaint  Patient presents with   Annual Exam    HPI   [name] is seen today for complete physical.  Not seen in over a year.  He has history of gout, hypertension, hyperlipidemia.  He apparently is only taking 2 blood pressure medications including losartan HCTZ and amlodipine.  He is supposed to be on Aldactone but apparently not been taking this.  He also has not aware of taking atorvastatin recently.  Does take allopurinol 300 mg daily.  Generally doing fairly well.  No specific complaints today.  Health maintenance reviewed:  -Overdue for repeat colonoscopy.  Colonoscopy 2019 with adenomatous polyps with recommended 3-year follow-up. -No history of shingles vaccine -Tetanus due this year -Prior hepatitis C screen negative  Family history reviewed.  His mother died of MRSA complications in her 11s.  She had history of diabetes and also some type of heart problem but is not sure what type.  His father died motor vehicle accident exact age unknown but probably 30s.  Sister with gastric cancer and another died in her 30s of cerebral aneurysm.  He has a sister with type 2 diabetes and her brother also with type 2 diabetes.  Social history-married.  Second marriage.  He has 5 boys and 5 girls from his first marriage and 3 stepchildren.  28 grandchildren.  Non-smoker.  Only occasional alcohol use.  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 knee   Glaucoma    not on medicine yet   Gout    Gout    Hyperlipidemia    Hypertension    Past Surgical History:  Procedure Laterality Date   COLONOSCOPY     KNEE ARTHROSCOPY Left 06/24/2017   POLYPECTOMY      reports that he has never smoked. He has never used smokeless tobacco. He reports that he does not drink alcohol and does not use drugs. family history includes Cancer in his sister; Diabetes in his brother, mother, and sister; Heart disease in his mother; Hypertension in his mother. No Known Allergies  Review of Systems  Constitutional:  Negative for chills, fever, malaise/fatigue and weight loss.  HENT:  Negative for hearing loss.   Eyes:  Negative for blurred vision and double vision.  Respiratory:  Negative for cough and shortness of breath.   Cardiovascular:  Negative for chest pain, palpitations and leg swelling.  Gastrointestinal:  Negative for abdominal pain, blood in stool, constipation and diarrhea.  Genitourinary:  Negative for dysuria.  Skin:  Negative for rash.  Neurological:  Negative for dizziness, speech change, seizures, loss of consciousness and headaches.  Psychiatric/Behavioral:  Negative for depression.       Objective:     BP 124/66 (BP Location: Left Arm,  Patient Position: Sitting, Cuff Size: Large)   Pulse 70   Temp 98.6 F (37 C) (Oral)   Ht 6' 3.5" (1.918 m)   Wt (!) 323 lb 12.8 oz (146.9 kg)   SpO2 97%   BMI 39.94 kg/m  BP Readings from Last 3 Encounters:  08/01/22 124/66  01/25/21 136/74  12/26/20 (!) 150/90   Wt Readings from Last 3 Encounters:  08/01/22 (!) 323 lb 12.8 oz (146.9 kg)  01/25/21 (!) 313 lb 3.2 oz (142.1 kg)  12/26/20 (!) 308 lb 8 oz (139.9 kg)      Physical Exam Vitals reviewed.  Constitutional:      General: He is not in acute distress.    Appearance: He is well-developed.  HENT:     Head: Normocephalic and atraumatic.     Right Ear: External ear normal.     Left Ear: External ear normal.   Eyes:     Conjunctiva/sclera: Conjunctivae normal.     Pupils: Pupils are equal, round, and reactive to light.  Neck:     Thyroid: No thyromegaly.  Cardiovascular:     Rate and Rhythm: Normal rate and regular rhythm.     Heart sounds: Normal heart sounds. No murmur heard. Pulmonary:     Effort: No respiratory distress.     Breath sounds: No wheezing or rales.  Abdominal:     General: Bowel sounds are normal. There is no distension.     Palpations: Abdomen is soft. There is no mass.     Tenderness: There is no abdominal tenderness. There is no guarding or rebound.  Musculoskeletal:     Cervical back: Normal range of motion and neck supple.  Lymphadenopathy:     Cervical: No cervical adenopathy.  Skin:    Findings: No rash.  Neurological:     Mental Status: He is alert and oriented to person, place, and time.     Cranial Nerves: No cranial nerve deficit.      No results found for any visits on 08/01/22.    The ASCVD Risk score (Arnett DK, et al., 2019) failed to calculate for the following reasons:   Cannot find a previous HDL lab   Cannot find a previous total cholesterol lab    Assessment & Plan:   Problem List Items Addressed This Visit   None Visit Diagnoses     Screening for colon cancer    -  Primary   Physical exam       Relevant Orders   Ambulatory referral to Gastroenterology   Basic metabolic panel   Lipid panel   CBC with Differential/Platelet   Hepatic function panel   PSA     Patient has history of hypertension and hyperlipidemia as well as gout.  His gout is controlled on allopurinol.  Blood pressure is currently well-controlled on amlodipine and losartan/HCTZ.  Has previously taken Aldactone as well.  Currently not on Lipitor  We discussed several following health maintenance issues  -Recommend annual flu vaccine -Tdap given today -Shingrix given and recommend booster in 2 to 6 months -Referral placed for repeat colonoscopy.  He is overdue  with history of adenomatous polyps -Recommend try to lose some weight -Obtain follow-up labs.  Include prostate cancer screening -Will plan to calculate 10-year risk for CAD and probably get back on atorvastatin if indicated  No follow-ups on file.    Evelena Peat, MD

## 2022-08-01 NOTE — Patient Instructions (Signed)
I placed referral for repeat  colonoscopy.

## 2022-08-04 MED ORDER — ATORVASTATIN CALCIUM 40 MG PO TABS: 40.0000 mg | ORAL_TABLET | Freq: Every day | ORAL | 0 refills | Status: DC

## 2022-08-04 NOTE — Addendum Note (Signed)
Addended by: Christy Sartorius on: 08/04/2022 10:03 AM   Modules accepted: Orders

## 2022-08-04 NOTE — Addendum Note (Signed)
Addended by: Christy Sartorius on: 08/04/2022 10:05 AM   Modules accepted: Orders

## 2022-10-06 ENCOUNTER — Other Ambulatory Visit: Payer: 59

## 2022-10-06 ENCOUNTER — Telehealth: Payer: Self-pay

## 2022-10-06 DIAGNOSIS — E785 Hyperlipidemia, unspecified: Secondary | ICD-10-CM

## 2022-10-06 DIAGNOSIS — R972 Elevated prostate specific antigen [PSA]: Secondary | ICD-10-CM

## 2022-10-06 LAB — LIPID PANEL
Cholesterol: 203 mg/dL — ABNORMAL HIGH (ref 0–200)
HDL: 45.6 mg/dL (ref >39.00)
LDL Cholesterol: 140 mg/dL — ABNORMAL HIGH (ref 0–99)
NonHDL: 157.11
Total CHOL/HDL Ratio: 4
Triglycerides: 88 mg/dL (ref 0.0–149.0)
VLDL: 17.6 mg/dL (ref 0.0–40.0)

## 2022-10-06 LAB — HEPATIC FUNCTION PANEL
ALT: 32 U/L (ref 0–53)
AST: 22 U/L (ref 0–37)
Albumin: 4.1 g/dL (ref 3.5–5.2)
Alkaline Phosphatase: 87 U/L (ref 39–117)
Bilirubin, Direct: 0.1 mg/dL (ref 0.0–0.3)
Total Bilirubin: 0.5 mg/dL (ref 0.2–1.2)
Total Protein: 7 g/dL (ref 6.0–8.3)

## 2022-10-06 LAB — PSA: PSA: 2.18 ng/mL (ref 0.10–4.00)

## 2022-10-06 NOTE — Telephone Encounter (Signed)
Patient has lab appointment today and labs were ordered per results from 08/01/2022

## 2022-10-07 MED ORDER — EZETIMIBE 10 MG PO TABS: 10.0000 mg | ORAL_TABLET | Freq: Every day | ORAL | 0 refills | Status: DC

## 2022-10-30 ENCOUNTER — Other Ambulatory Visit: Payer: Self-pay | Admitting: Family Medicine

## 2022-11-05 ENCOUNTER — Encounter: Payer: Self-pay | Admitting: Family Medicine

## 2022-11-05 ENCOUNTER — Ambulatory Visit (INDEPENDENT_AMBULATORY_CARE_PROVIDER_SITE_OTHER): Payer: 59 | Admitting: Family Medicine

## 2022-11-05 ENCOUNTER — Ambulatory Visit: Payer: 59

## 2022-11-05 VITALS — BP 150/80 | HR 61 | Temp 98.3°F | Ht 75.5 in | Wt 321.6 lb

## 2022-11-05 DIAGNOSIS — M1A0790 Idiopathic chronic gout, unspecified ankle and foot, without tophus (tophi): Secondary | ICD-10-CM | POA: Diagnosis not present

## 2022-11-05 DIAGNOSIS — M5412 Radiculopathy, cervical region: Secondary | ICD-10-CM

## 2022-11-05 DIAGNOSIS — I10 Essential (primary) hypertension: Secondary | ICD-10-CM

## 2022-11-05 MED ORDER — PREDNISONE 10 MG PO TABS: ORAL_TABLET | ORAL | 0 refills | Status: DC

## 2022-11-05 MED ORDER — CICLOPIROX OLAMINE 0.77 % EX CREA: TOPICAL_CREAM | Freq: Two times a day (BID) | CUTANEOUS | 1 refills | Status: AC

## 2022-11-05 MED ORDER — ALLOPURINOL 300 MG PO TABS: 300.0000 mg | ORAL_TABLET | Freq: Every day | ORAL | 3 refills | Status: AC

## 2022-11-05 MED ORDER — HYDROCODONE-ACETAMINOPHEN 5-325 MG PO TABS: 1.0000 | ORAL_TABLET | Freq: Four times a day (QID) | ORAL | 0 refills | Status: AC | PRN

## 2022-11-05 NOTE — Progress Notes (Signed)
Established Patient Office Visit  Subjective   Patient ID: Eric Mann, male    DOB: [birth year]  Age: 62 y.o. MRN: [id number]  Chief Complaint  Patient presents with   Neck Pain    Patient complains of right sided neck pain, x4 days    Shoulder Pain    Patient complains right shoulder pain, x4 days    Numbness    Patient complains of right arm numbness, x4 days     HPI   Eric Mann is seen for new problem of 4-day history of right-sided neck pain with radiation mostly down the right upper extremity but somewhat to the right side chest area.  Denies any injury.  Pain has been intense at times.  Has some associated paresthesias involving the right arm and forearm.  No definite weakness.  Pain is worse with rotation and lateral bending to the left and also somewhat with neck extension.  He tried Tylenol and ibuprofen with mild relief.  Typically having a lot of night pain.  No prior history of neck difficulties.  His job does require lifting patients occasionally.  He is also needing refills of allopurinol.  No recent gout flares.  History of tinea cruris.  Requesting refills of Loprox.  Blood pressure up today.  He does take amlodipine and losartan/HCTZ regularly.  Blood pressure generally well-controlled and this is atypical for him today.  Compliant with medications.  Past Medical History:  Diagnosis Date   Arthritis    lt. knee   Glaucoma    not on medicine yet   Gout    Gout    Hyperlipidemia    Hypertension    Past Surgical History:  Procedure Laterality Date   COLONOSCOPY     KNEE ARTHROSCOPY Left 06/24/2017   POLYPECTOMY      reports that he has never smoked. He has never used smokeless tobacco. He reports that he does not drink alcohol and does not use drugs. family history includes Cancer in his sister; Diabetes in his brother, mother, and sister; Heart disease in his mother; Hypertension in his mother. No Known Allergies  Review of Systems  Constitutional:   Negative for chills and fever.  Respiratory:  Negative for cough and shortness of breath.   Cardiovascular:  Negative for chest pain.  Musculoskeletal:  Positive for neck pain.  Neurological:  Positive for sensory change. Negative for focal weakness.      Objective:     BP (!) 150/80 (BP Location: Left Arm, Patient Position: Sitting, Cuff Size: Large)   Pulse 61   Temp 98.3 F (36.8 C) (Oral)   Ht 6' 3.5" (1.918 m)   Wt (!) 321 lb 9.6 oz (145.9 kg)   SpO2 98%   BMI 39.67 kg/m  BP Readings from Last 3 Encounters:  11/05/22 (!) 150/80  08/01/22 124/66  01/25/21 136/74   Wt Readings from Last 3 Encounters:  11/05/22 (!) 321 lb 9.6 oz (145.9 kg)  08/01/22 (!) 323 lb 12.8 oz (146.9 kg)  01/25/21 (!) 313 lb 3.2 oz (142.1 kg)      Physical Exam Vitals reviewed.  Constitutional:      Appearance: Normal appearance.  Neck:     Comments: No spinal tenderness.  He does have right-sided neck pain with rotation and lateral bending to the left. Cardiovascular:     Rate and Rhythm: Normal rate and regular rhythm.  Pulmonary:     Effort: Pulmonary effort is normal.     Breath sounds: Normal  breath sounds.  Neurological:     Mental Status: He is alert.     Comments: Good grip strength bilaterally.  No focal weakness right upper extremity.  Deep tendon reflexes were trace bilaterally upper extremities.  No sensory impairment to touch      No results found for any visits on 11/05/22.  Last CBC Lab Results  Component Value Date   WBC 5.8 08/01/2022   HGB 14.4 08/01/2022   HCT 43.6 08/01/2022   MCV 87.5 08/01/2022   MCH 29.6 09/24/2012   RDW 14.6 08/01/2022   PLT 223.0 08/01/2022   Last metabolic panel Lab Results  Component Value Date   GLUCOSE 102 (H) 08/01/2022   NA 140 08/01/2022   K 3.5 08/01/2022   CL 100 08/01/2022   CO2 30 08/01/2022   BUN 15 08/01/2022   CREATININE 1.25 08/01/2022   GFR 61.96 08/01/2022   CALCIUM 9.2 08/01/2022   PROT 7.0 10/06/2022    ALBUMIN 4.1 10/06/2022   BILITOT 0.5 10/06/2022   ALKPHOS 87 10/06/2022   AST 22 10/06/2022   ALT 32 10/06/2022   Last lipids Lab Results  Component Value Date   CHOL 203 (H) 10/06/2022   HDL 45.60 10/06/2022   LDLCALC 140 (H) 10/06/2022   LDLDIRECT 145.2 04/30/2012   TRIG 88.0 10/06/2022   CHOLHDL 4 10/06/2022   Last hemoglobin A1c No results found for: "HGBA1C"    The 10-year ASCVD risk score (Arnett DK, et al., 2019) is: 19.6%    Assessment & Plan:   #1 right sided neck pain.  He is describing some right sided cervical radiculitis pain which started 4 days ago without reported specific injury.  Nonfocal neuroexam. -Obtain cervical spine films -Start prednisone taper -Wrote for limited Vicodin 5/325 mg 1-2 every 6 hours as needed for severe pain #20 with no refill -Watch for any weakness, progressive paresthesias, or progressive pain  #2 gout stable on allopurinol.  Refill allopurinol for 1 year  #3 hypertension.  Generally well-controlled.  Today's reading was up and atypical.  Possibly exacerbated by pain.  Continue amlodipine and losartan HCTZ and monitor closely.  Be in touch if consistently greater than 140/90  #4 history of fungal rash.  Patient requesting refills of Loprox.  This was sent in   No follow-ups on file.    Evelena Peat, MD

## 2022-11-13 ENCOUNTER — Ambulatory Visit (AMBULATORY_SURGERY_CENTER): Payer: 59 | Admitting: *Deleted

## 2022-11-13 ENCOUNTER — Encounter: Payer: Self-pay | Admitting: Internal Medicine

## 2022-11-13 VITALS — Ht 75.5 in | Wt 321.0 lb

## 2022-11-13 DIAGNOSIS — Z86010 Personal history of colonic polyps: Secondary | ICD-10-CM

## 2022-11-13 MED ORDER — NA SULFATE-K SULFATE-MG SULF 17.5-3.13-1.6 GM/177ML PO SOLN
1.0000 | Freq: Once | ORAL | 0 refills | Status: AC
Start: 2022-11-13 — End: 2022-11-13

## 2022-11-13 NOTE — Progress Notes (Signed)
Pre visit completed over telephone Instructions forwarded through [facility] and sent to home address Patient states all questions answered to his satisfaction.   No egg or soy allergy known to patient  No issues known to pt with past sedation with any surgeries or procedures Patient denies ever being told they had issues or difficulty with intubation  No FH of Malignant Hyperthermia Pt is not on diet pills Pt is not on  home 02  Pt is not on blood thinners  Pt denies issues with constipation  No A fib or A flutter Have any cardiac testing pending-- NO Pt instructed to use Single[employer][email or web address] or GoodRx for a price reduction on prep

## 2022-11-21 ENCOUNTER — Other Ambulatory Visit: Payer: Self-pay | Admitting: Family Medicine

## 2022-11-28 ENCOUNTER — Ambulatory Visit: Payer: 59 | Admitting: Internal Medicine

## 2022-11-28 ENCOUNTER — Encounter: Payer: Self-pay | Admitting: Internal Medicine

## 2022-11-28 VITALS — BP 125/80 | HR 46 | Temp 97.2°F | Resp 13 | Ht 75.5 in | Wt 321.0 lb

## 2022-11-28 DIAGNOSIS — Z860100 Personal history of colon polyps, unspecified: Secondary | ICD-10-CM | POA: Diagnosis not present

## 2022-11-28 DIAGNOSIS — D122 Benign neoplasm of ascending colon: Secondary | ICD-10-CM | POA: Diagnosis not present

## 2022-11-28 DIAGNOSIS — Z09 Encounter for follow-up examination after completed treatment for conditions other than malignant neoplasm: Secondary | ICD-10-CM | POA: Diagnosis present

## 2022-11-28 MED ORDER — SODIUM CHLORIDE 0.9 % IV SOLN: 500.0000 mL | Freq: Once | INTRAVENOUS | Status: DC

## 2022-11-28 NOTE — Progress Notes (Signed)
Called to room to assist during endoscopic procedure.  Patient ID and intended procedure confirmed with present staff. Received instructions for my participation in the procedure from the performing physician.  

## 2022-11-28 NOTE — Op Note (Signed)
[facility] Patient Name: Eric Mann Procedure Date: 11/28/2022 12:00 PM MRN: [id number] Endoscopist: Wilhemina Bonito. Marina Goodell , MD, [id number] Age: 61 Referring MD:  Date of Birth: [birth year] Gender: Male Account #: [id number] Procedure:                Colonoscopy with cold snare polypectomy x 2 Indications:              High risk colon cancer surveillance: Personal                            history of multiple (3 or more) adenomas. Prior                            examinations 2014, 2019 Medicines:                Monitored Anesthesia Care Procedure:                Pre-Anesthesia Assessment:                           - Prior to the procedure, a History and Physical                            was performed, and patient medications and                            allergies were reviewed. The patient's tolerance of                            previous anesthesia was also reviewed. The risks                            and benefits of the procedure and the sedation                            options and risks were discussed with the patient.                            All questions were answered, and informed consent                            was obtained. Prior Anticoagulants: The patient has                            taken no anticoagulant or antiplatelet agents.                            After reviewing the risks and benefits, the patient                            was deemed in satisfactory condition to undergo the                            procedure.  After obtaining informed consent, the colonoscope                            was passed under direct vision. Throughout the                            procedure, the patient's blood pressure, pulse, and                            oxygen saturations were monitored continuously. The                            Olympus Scope SN: [id number] was introduced through                            the anus and advanced to  the the cecum, identified                            by appendiceal orifice and ileocecal valve. The                            ileocecal valve, appendiceal orifice, and rectum                            were photographed. The quality of the bowel                            preparation was excellent. The colonoscopy was                            performed without difficulty. The patient tolerated                            the procedure well. The bowel preparation used was                            SUPREP via split dose instruction. Scope In: 12:08:27 PM Scope Out: 12:24:42 PM Scope Withdrawal Time: 0 hours 13 minutes 3 seconds  Total Procedure Duration: 0 hours 16 minutes 15 seconds  Findings:                 Two polyps were found in the ascending colon. The                            polyps were 2 to 4 mm in size. These polyps were                            removed with a cold snare. Resection and retrieval                            were complete.                           Multiple diverticula were found in the  left colon                            and right colon.                           Internal hemorrhoids were found during retroflexion.                           The exam was otherwise without abnormality on                            direct and retroflexion views. Complications:            No immediate complications. Estimated blood loss:                            None. Estimated Blood Loss:     Estimated blood loss: none. Impression:               - Two 2 to 4 mm polyps in the ascending colon,                            removed with a cold snare. Resected and retrieved.                           - Diverticulosis in the left colon and in the right                            colon.                           - Internal hemorrhoids.                           - The examination was otherwise normal on direct                            and retroflexion views. Recommendation:            - Repeat colonoscopy in 5 years for surveillance.                           - Patient has a contact number available for                            emergencies. The signs and symptoms of potential                            delayed complications were discussed with the                            patient. Return to normal activities tomorrow.                            Written discharge instructions were provided to the  patient.                           - Resume previous diet.                           - Continue present medications.                           - Await pathology results. Wilhemina Bonito. Marina Goodell, MD 11/28/2022 12:30:41 PM This report has been signed electronically.

## 2022-11-28 NOTE — Progress Notes (Signed)
HISTORY OF PRESENT ILLNESS:  Eric Mann is a 62 y.o. male with history of multiple numbness colon polyps.  Presents today for surveillance colonoscopy  REVIEW OF SYSTEMS:  All non-GI ROS negative except for  Past Medical History:  Diagnosis Date   Arthritis    lt. knee   Glaucoma    not on medicine yet   Gout    Gout    Hyperlipidemia    Hypertension     Past Surgical History:  Procedure Laterality Date   COLONOSCOPY     KNEE ARTHROSCOPY Left 06/24/2017   POLYPECTOMY      Social History [name]  reports that he has never smoked. He has never used smokeless tobacco. He reports that he does not drink alcohol and does not use drugs.  family history includes Cancer in his sister; Diabetes in his brother, mother, and sister; Heart disease in his mother; Hypertension in his mother.  No Known Allergies     PHYSICAL EXAMINATION: Vital signs: BP (!) 164/74 (Cuff Size: Large)   Pulse (!) 56   Temp (!) 97.2 F (36.2 C) (Temporal)   Ht 6' 3.5" (1.918 m)   Wt (!) 321 lb (145.6 kg)   SpO2 99%   BMI 39.59 kg/m  General: Well-developed, well-nourished, no acute distress HEENT: Sclerae are anicteric, conjunctiva pink. Oral mucosa intact Lungs: Clear Heart: Regular Abdomen: soft, nontender, nondistended, no obvious ascites, no peritoneal signs, normal bowel sounds. No organomegaly. Extremities: No edema Psychiatric: alert and oriented x3. Cooperative     ASSESSMENT:  Personal history of multiple adenomatous colon polyps   PLAN:  Surveillance colonoscopy

## 2022-11-28 NOTE — Patient Instructions (Addendum)
Resume previous diet Continue present medications Await pathology results Repeat colonoscopy in 5 years Handouts/information given for polyps, diverticulosis and hemorrhoids  YOU HAD AN ENDOSCOPIC PROCEDURE TODAY AT THE [facility]:   Refer to the procedure report that was given to you for any specific questions about what was found during the examination.  If the procedure report does not answer your questions, please call your gastroenterologist to clarify.  If you requested that your care partner not be given the details of your procedure findings, then the procedure report has been included in a sealed envelope for you to review at your convenience later.  YOU SHOULD EXPECT: Some feelings of bloating in the abdomen. Passage of more gas than usual.  Walking can help get rid of the air that was put into your GI tract during the procedure and reduce the bloating. If you had a lower endoscopy (such as a colonoscopy or flexible sigmoidoscopy) you may notice spotting of blood in your stool or on the toilet paper. If you underwent a bowel prep for your procedure, you may not have a normal bowel movement for a few days.  Please Note:  You might notice some irritation and congestion in your nose or some drainage.  This is from the oxygen used during your procedure.  There is no need for concern and it should clear up in a day or so.  SYMPTOMS TO REPORT IMMEDIATELY:  Following lower endoscopy (colonoscopy):  Excessive amounts of blood in the stool  Significant tenderness or worsening of abdominal pains  Swelling of the abdomen that is new, acute  Fever of 100F or higher  For urgent or emergent issues, a gastroenterologist can be reached at any hour by calling [phone]. Do not use MyChart messaging for urgent concerns.    DIET:  We do recommend a small meal at first, but then you may proceed to your regular diet.  Drink plenty of fluids but you should avoid alcoholic beverages  for 24 hours.  ACTIVITY:  You should plan to take it easy for the rest of today and you should NOT DRIVE or use heavy machinery until tomorrow (because of the sedation medicines used during the test).    FOLLOW UP: Our staff will call the number listed on your records the next business day following your procedure.  We will call around 7:15- 8:00 am to check on you and address any questions or concerns that you may have regarding the information given to you following your procedure. If we do not reach you, we will leave a message.     If any biopsies were taken you will be contacted by phone or by letter within the next 1-3 weeks.  Please call us at [phone] if you have not heard about the biopsies in 3 weeks.    SIGNATURES/CONFIDENTIALITY: You and/or your care partner have signed paperwork which will be entered into your electronic medical record.  These signatures attest to the fact that that the information above on your After Visit Summary has been reviewed and is understood.  Full responsibility of the confidentiality of this discharge information lies with you and/or your care-partner.

## 2022-11-28 NOTE — Progress Notes (Signed)
Report to PACU, RN, vss, BBS= Clear.  

## 2022-11-28 NOTE — Progress Notes (Signed)
Pt's states no medical or surgical changes since previsit or office visit. 

## 2022-12-01 ENCOUNTER — Telehealth: Payer: Self-pay | Admitting: *Deleted

## 2022-12-01 NOTE — Telephone Encounter (Signed)
  Follow up Call-     11/28/2022   10:58 AM  Call back number  Post procedure Call Back phone  # [phone]  Permission to leave phone message Yes     Patient questions:  Do you have a fever, pain , or abdominal swelling? No. Pain Score  0 *  Have you tolerated food without any problems? Yes.    Have you been able to return to your normal activities? Yes.    Do you have any questions about your discharge instructions: Diet   No. Medications  No. Follow up visit  No.  Do you have questions or concerns about your Care? No.  Actions: * If pain score is 4 or above: No action needed, pain <4.

## 2022-12-02 ENCOUNTER — Encounter: Payer: Self-pay | Admitting: Internal Medicine

## 2022-12-02 LAB — SURGICAL PATHOLOGY

## 2023-01-02 ENCOUNTER — Other Ambulatory Visit: Payer: Self-pay | Admitting: Family Medicine

## 2023-01-30 ENCOUNTER — Ambulatory Visit: Payer: 59

## 2023-02-09 ENCOUNTER — Other Ambulatory Visit: Payer: Self-pay | Admitting: Family Medicine

## 2023-08-07 ENCOUNTER — Telehealth: Payer: Self-pay

## 2023-08-07 NOTE — Transitions of Care (Post Inpatient/ED Visit) (Signed)
 08/07/2023  Name: Eric Mann MRN: [id number] DOB: [birth year]  Today's TOC FU Call Status: Today's TOC FU Call Status:: Successful TOC FU Call Completed TOC FU Call Complete Date: 08/07/23 Patient's Name and Date of Birth confirmed.  Transition Care Management Follow-up Telephone Call Date of Discharge: 08/06/23 Discharge Facility: Other [facility]) Name of Other (Non[facility]) Discharge Facility: [facility] Type of Discharge: Inpatient Admission Primary Inpatient Discharge Diagnosis:: Sepsis How have you been since you were released from the hospital?: Better (Patient reports feeling better however states still having soreness in his right knee) Any questions or concerns?: No  Items Reviewed: Did you receive and understand the discharge instructions provided?: Yes Medications obtained,verified, and reconciled?: Yes (Medications Reviewed) Any new allergies since your discharge?: No Dietary orders reviewed?: Yes Type of Diet Ordered:: regular diet 2 gm sodium restriction Do you have support at home?: Yes People in Home [RPT]: spouse Name of Support/Comfort Primary Source: wife  Medications Reviewed Today: Medications Reviewed Today     Reviewed by Roe Koffman E, RN (Registered Nurse) on 08/07/23 at [phone]  Med List Status: <None>   Medication Order Taking? Sig Documenting Provider Last Dose Status Informant  allopurinol  (ZYLOPRIM ) 300 MG tablet [id number] Yes Take 1 tablet (300 mg total) by mouth daily. Marquetta Sit, MD  Active   amLODipine  (NORVASC ) 5 MG tablet [id number] Yes TAKE 1 TABLET(5 MG) BY MOUTH DAILY Burchette, Marijean Shouts, MD  Active   atorvastatin  (LIPITOR) 40 MG tablet [id number] Yes TAKE 1 TABLET(40 MG) BY MOUTH DAILY Burchette, Marijean Shouts, MD  Active   ciclopirox  (LOPROX ) 0.77 % cream [id number]  Apply topically 2 (two) times daily.  Patient not taking: Reported on 08/07/2023   Marquetta Sit, MD  Active   colchicine  (COLCRYS ) 0.6 MG tablet [id number]   Take 1 tablet (0.6 mg total) by mouth daily.  Patient not taking: Reported on 08/07/2023   Marquetta Sit, MD  Active   ezetimibe  (ZETIA ) 10 MG tablet [id number]  TAKE 1 TABLET(10 MG) BY MOUTH DAILY Burchette, Marijean Shouts, MD  Active   ibuprofen  (ADVIL ) 800 MG tablet [id number]  Take by mouth.  Patient not taking: Reported on 08/07/2023   [provider]  Active   losartan -hydrochlorothiazide (HYZAAR) 50-12.5 MG tablet [id number] Yes TAKE 1 TABLET BY MOUTH DAILY Burchette, Marijean Shouts, MD  Active   naproxen  (NAPROSYN ) 500 MG tablet [id number]  Take 1 tablet (500 mg total) by mouth 2 (two) times daily.  Patient not taking: Reported on 08/07/2023   Marquetta Sit, MD  Active   predniSONE  (DELTASONE ) 10 MG tablet [id number] Yes Taper as follows: 6-6-4-4-3-3-2-2-1-1 Marquetta Sit, MD  Active             Home Care and Equipment/Supplies: Were Home Health Services Ordered?: No Any new equipment or medical supplies ordered?: Yes (walker was ordered prior to discharged and delivered) Name of Medical supply agency?: patient states provided walker at [facility] Were you able to get the equipment/medical supplies?: Yes Do you have any questions related to the use of the equipment/supplies?: No  Functional Questionnaire: Do you need assistance with bathing/showering or dressing?: No Do you need assistance with meal preparation?: No Do you need assistance with eating?: No Do you have difficulty maintaining continence: No Do you need assistance with getting out of bed/getting out of a chair/moving?: No Do you have difficulty managing or taking your medications?: No  Follow up appointments reviewed: PCP Follow-up appointment confirmed?: No (patient reports  he will cal and make hospital follow up appointment.) MD Provider Line Number:[phone] Given: No Specialist Hospital Follow-up appointment confirmed?: No Reason Specialist Follow-Up Not Confirmed:  (patient to follow up with  primary care provider to discuss orthopedic referral.) Do you need transportation to your follow-up appointment?: No Do you understand care options if your condition(s) worsen?: Yes-patient verbalized understanding  SDOH Interventions Today    Flowsheet Row Most Recent Value  SDOH Interventions   Food Insecurity Interventions Intervention Not Indicated  Transportation Interventions Intervention Not Indicated   Patient reports doing well.  Main concern is right knee pain. Reviewed and offered 30 day TOC program.  Consent obtained.  Patient enrolled.    Goals      VBCI Transitions of Care (TOC) Care Plan     Problems:  Recent Hospitalization for treatment of sepsis No Hospital Follow Up Provider appointment    Goal:  Over the next 30 days, the patient will not experience hospital readmission  Interventions:  Transitions of Care: Doctor Visits  - discussed the importance of doctor visits Post discharge activity limitations prescribed by provider reviewed Medications reviewed and encouraged patient to take all medications as prescribed Confirmed patient has walker Reviewed diet order: low salt diet Advised to call primary care provider for any new/ ongoing concerns Advised to call EMS for severe symptoms Reviewed and offered 30 day TOC program - patient consented Confirmed transportation to appointments Confirmed patient has social support at home. Confirmed GI symptoms have resolved. Initial TOC encounter routed to MD.   Patient Self Care Activities:  Attend all scheduled provider appointments Call pharmacy for medication refills 3-7 days in advance of running out of medications Call provider office for new concerns or questions  Notify RN Care Manager of TOC call rescheduling needs Participate in Transition of Care Program/Attend TOC scheduled calls Perform all self care activities independently  Take medications as prescribed   Call primary provider and schedule an  appointment Call EMS for severe symptoms Follow a low salt diet  Use walker as directed  Plan:  Telephone follow up appointment with care management team member scheduled for:  08/14/23 at 11 am The patient has been provided with contact information for the care management team and has been advised to call with any health related questions or concerns.          Verba Girt RN, BSN, CCM [employer], Population Health Case Manager Phone: [phone]

## 2023-08-10 ENCOUNTER — Ambulatory Visit (INDEPENDENT_AMBULATORY_CARE_PROVIDER_SITE_OTHER): Admitting: Family Medicine

## 2023-08-10 ENCOUNTER — Encounter: Payer: Self-pay | Admitting: Family Medicine

## 2023-08-10 VITALS — BP 142/68 | HR 69 | Temp 98.0°F | Wt 306.5 lb

## 2023-08-10 DIAGNOSIS — S83241D Other tear of medial meniscus, current injury, right knee, subsequent encounter: Secondary | ICD-10-CM

## 2023-08-10 DIAGNOSIS — I10 Essential (primary) hypertension: Secondary | ICD-10-CM

## 2023-08-10 DIAGNOSIS — Z8619 Personal history of other infectious and parasitic diseases: Secondary | ICD-10-CM | POA: Diagnosis not present

## 2023-08-10 NOTE — Patient Instructions (Signed)
 I have placed referral to orthopedics.

## 2023-08-10 NOTE — Progress Notes (Signed)
 Established Patient Office Visit  Subjective   Patient ID: Eric Mann, male    DOB: December 28, 1960  Age: 63 y.o. MRN: [id number]  Chief Complaint  Patient presents with   Hospitalization Follow-up    HPI   Eric Mann is seen for recent hospital follow-up from [facility].  He states on Father's Day 15 June he was singing in his choir at church.  He was up on his toes and noted sharp pain right medial knee.  He had some swelling afterwards.  He was driving home and had rather acute nausea and 3-4 episodes of vomiting accompanied by some diaphoresis.  No chest pain.  Pulled over and felt very overheated .  He felt presyncopal.  EMS was called.  Paramedics took him in.  Prior to arrival in the ER he had bowel movement and episode of some incontinence.  Had another large odorous bowel movement in the ER.  Felt somewhat better from a GI perspective afterwards.  Hospital discharge notes from [facility] health were reviewed.  He was hospitalized through the 19th.  In the ER was afebrile.  Chest x-ray showed no acute abnormality.  CAT scan of the head showed no acute intracranial abnormality.'s CAT scan abdomen pelvis no acute findings.  He was given normal saline bolus along with IV Flagyl  and Zofran .  Started on empiric antibiotic and IV fluids and supportive care.  His blood cultures remain negative and he continued improved throughout hospitalization.  Regarding right knee pain he was diagnosed with tear of the medial meniscus.  Also MRI showed fairly severe osteoarthritis of the right knee.  He did apparently have steroid injection day of discharge but still has considerable knee pain.  Still has some swelling.  Would like to see orthopedist.  No further GI symptoms since discharge.  No fever.  Past Medical History:  Diagnosis Date   Arthritis    lt. knee   Glaucoma    not on medicine yet   Gout    Gout    Hyperlipidemia    Hypertension    Past Surgical History:  Procedure Laterality  Date   COLONOSCOPY     KNEE ARTHROSCOPY Left 06/24/2017   POLYPECTOMY      reports that he has never smoked. He has never used smokeless tobacco. He reports that he does not drink alcohol and does not use drugs. family history includes Cancer in his sister; Diabetes in his brother, mother, and sister; Heart disease in his mother; Hypertension in his mother. No Known Allergies  Review of Systems  Constitutional:  Negative for chills, fever and malaise/fatigue.  Eyes:  Negative for blurred vision.  Respiratory:  Negative for shortness of breath.   Cardiovascular:  Negative for chest pain.  Gastrointestinal:  Negative for abdominal pain, blood in stool, constipation, diarrhea, melena, nausea and vomiting.  Musculoskeletal:  Positive for joint pain.  Neurological:  Negative for dizziness, weakness and headaches.      Objective:     BP (!) 142/68 (BP Location: Right Arm, Cuff Size: Large)   Pulse 69   Temp 98 F (36.7 C) (Oral)   Wt (!) 306 lb 8 oz (139 kg)   SpO2 97%   BMI 37.80 kg/m  BP Readings from Last 3 Encounters:  08/10/23 (!) 142/68  11/28/22 125/80  11/05/22 (!) 150/80   Wt Readings from Last 3 Encounters:  08/10/23 (!) 306 lb 8 oz (139 kg)  11/28/22 (!) 321 lb (145.6 kg)  11/13/22 (!) 321 lb (  145.6 kg)      Physical Exam Vitals reviewed.  Constitutional:      General: He is not in acute distress.    Appearance: He is well-developed. He is not ill-appearing.  HENT:     Right Ear: External ear normal.     Left Ear: External ear normal.   Eyes:     Pupils: Pupils are equal, round, and reactive to light.   Neck:     Thyroid : No thyromegaly.   Cardiovascular:     Rate and Rhythm: Normal rate and regular rhythm.  Pulmonary:     Effort: Pulmonary effort is normal. No respiratory distress.     Breath sounds: Normal breath sounds. No wheezing or rales.   Musculoskeletal:     Cervical back: Neck supple.     Comments: Right knee full range of motion.  No  warmth.  No erythema.  Very small effusion.  Mild medial joint line tenderness.   Neurological:     Mental Status: He is alert and oriented to person, place, and time.      No results found for any visits on 08/10/23.  Last CBC Lab Results  Component Value Date   WBC 5.8 08/01/2022   HGB 14.4 08/01/2022   HCT 43.6 08/01/2022   MCV 87.5 08/01/2022   MCH 29.6 09/24/2012   RDW 14.6 08/01/2022   PLT 223.0 08/01/2022   Last metabolic panel Lab Results  Component Value Date   GLUCOSE 102 (H) 08/01/2022   NA 140 08/01/2022   K 3.5 08/01/2022   CL 100 08/01/2022   CO2 30 08/01/2022   BUN 15 08/01/2022   CREATININE 1.25 08/01/2022   GFR 61.96 08/01/2022   CALCIUM  9.2 08/01/2022   PROT 7.0 10/06/2022   ALBUMIN 4.1 10/06/2022   BILITOT 0.5 10/06/2022   ALKPHOS 87 10/06/2022   AST 22 10/06/2022   ALT 32 10/06/2022   Last lipids Lab Results  Component Value Date   CHOL 203 (H) 10/06/2022   HDL 45.60 10/06/2022   LDLCALC 140 (H) 10/06/2022   LDLDIRECT 145.2 04/30/2012   TRIG 88.0 10/06/2022   CHOLHDL 4 10/06/2022   Last hemoglobin A1c No results found for: HGBA1C    The 10-year ASCVD risk score (Arnett DK, et al., 2019) is: 17.9%    Assessment & Plan:   #1 recent admission for possible sepsis.  Cultures remain negative.  Patient was covered with broad-spectrum IV antibiotics, IV fluids, and supportive care.  Felt pretty much back to baseline since discharge.  Follow-up promptly for any recurrent fever or other concerns  #2 acute right medial meniscus tear.  Orthopedic referral placed.  #3 hypertension.  Initial reading up today but did improve some after rest.  Continue close home monitoring be in touch if consistently over 130/80.  Continue low-sodium diet   No follow-ups on file.    Wolm Scarlet, MD

## 2023-08-14 ENCOUNTER — Other Ambulatory Visit: Payer: Self-pay

## 2023-08-14 NOTE — Patient Instructions (Signed)
 Visit Information  Thank you for taking time to visit with me today. Please don't hesitate to contact me if I can be of assistance to you before our next scheduled telephone appointment.  Following are the goals we discussed today:   Goals      VBCI Transitions of Care (TOC) Care Plan     Problems:  Recent Hospitalization for treatment of sepsis  06/27/205  Patient reports that he is doing well. No syncopal episodes since hospital discharge. Reports no new concerns today.  Continues to take his medications as prescribed.  No Hospital Follow Up Provider appointment 08/14/2023   PCP visit completed.  Goal:  Over the next 30 days, the patient will not experience hospital readmission  Interventions:  Transitions of Care: Doctor Visits  - discussed the importance of doctor visits. Pending ortho follow up Medications reviewed and encouraged patient to take all medications as prescribed Advised to call primary care provider for any new/ ongoing concerns Advised to call EMS for severe symptoms Encouraged patient to continue to eat and drink well.    Patient Self Care Activities:  Attend all scheduled provider appointments Call pharmacy for medication refills 3-7 days in advance of running out of medications Call provider office for new concerns or questions  Notify RN Care Manager of TOC call rescheduling needs Participate in Transition of Care Program/Attend TOC scheduled calls Perform all self care activities independently  Take medications as prescribed   Call EMS for severe symptoms Follow a low salt diet  Use walker as directed  Plan:  Telephone follow up appointment with care management team member scheduled for:  08/20/2023 at 10900am The patient has been provided with contact information for the care management team and has been advised to call with any health related questions or concerns.         Our next appointment is by telephone on 08/20/2023 at 0900  Please call the  care guide team at [phone] if you need to cancel or reschedule your appointment.   If you are experiencing a Mental Health or Behavioral Health Crisis or need someone to talk to, please call the Suicide and Crisis Lifeline: 988 call the [place]  National Suicide Prevention Lifeline: (615) 555-4079 or TTY: (603)281-0498 TTY 865-218-9553) to talk to a trained counselor call 1-800-273-TALK (toll free, 24 hour hotline) call 911   Patient verbalizes understanding of instructions and care plan provided today and agrees to view in MyChart. Active MyChart status and patient understanding of how to access instructions and care plan via MyChart confirmed with patient.     Alan Ee, RN, BSN, CEN [employer]- Transition of Care Team.  [facility] [phone]

## 2023-08-14 NOTE — Transitions of Care (Post Inpatient/ED Visit) (Signed)
 Transition of Care week 2  Visit Note  08/14/2023  Name: Eric Mann MRN: [id number]          DOB: 24-Dec-1960  Situation: Patient enrolled in [facility] 30-day program. Visit completed with patient by telephone.   Background: recent admission for syncopal episodes and knee pain  Initial Transition Care Management Follow-up Telephone Call    Past Medical History:  Diagnosis Date   Arthritis    lt. knee   Glaucoma    not on medicine yet   Gout    Gout    Hyperlipidemia    Hypertension     Assessment: Patient reports doing well. Denies any additional syncopal episodes. No NVD. Denies any new problems or concerns today.  Patient Reported Symptoms: Cognitive Cognitive Status: Able to follow simple commands, Alert and oriented to person, place, and time, Normal speech and language skills      Neurological Neurological Review of Symptoms: No symptoms reported    HEENT HEENT Symptoms Reported: No symptoms reported      Cardiovascular Cardiovascular Symptoms Reported: No symptoms reported    Respiratory Respiratory Symptoms Reported: No symptoms reported    Endocrine Patient reports the following symptoms related to hypoglycemia or hyperglycemia : No symptoms reported    Gastrointestinal Other Gastrointestinal Symptoms: denies any NVD, denies any abdominal pain.      Genitourinary Genitourinary Symptoms Reported: No symptoms reported    Integumentary Integumentary Symptoms Reported: No symptoms reported    Musculoskeletal Musculoskelatal Symptoms Reviewed: Other Other Musculoskeletal Symptoms: continues to have right knee pain and is waiting to see ortho for a plan of care. Musculoskeletal Management Strategies: Medication therapy, Medical device Musculoskeletal Comment: continues to use walker      Psychosocial Psychosocial Symptoms Reported: No symptoms reported         There were no vitals filed for this visit.  Medications Reviewed Today     Reviewed by Rumalda Alan PENNER, RN (Registered Nurse) on 08/14/23 at 1029  Med List Status: <None>   Medication Order Taking? Sig Documenting Provider Last Dose Status Informant  acetaminophen  (TYLENOL ) 325 MG tablet [id number] Yes Take 650 mg by mouth. [provider]  Active   allopurinol  (ZYLOPRIM ) 300 MG tablet [id number] Yes Take 1 tablet (300 mg total) by mouth daily. Micheal Wolm ORN, MD  Active   amLODipine  (NORVASC ) 5 MG tablet [id number] Yes TAKE 1 TABLET(5 MG) BY MOUTH DAILY Burchette, Wolm ORN, MD  Active   atorvastatin  (LIPITOR) 40 MG tablet [id number] Yes TAKE 1 TABLET(40 MG) BY MOUTH DAILY Burchette, Wolm ORN, MD  Active   ciclopirox  (LOPROX ) 0.77 % cream [id number] Yes Apply topically 2 (two) times daily. Micheal Wolm ORN, MD  Active   colchicine  (COLCRYS ) 0.6 MG tablet [id number] Yes Take 1 tablet (0.6 mg total) by mouth daily. Micheal Wolm ORN, MD  Active   ezetimibe  (ZETIA ) 10 MG tablet [id number] Yes TAKE 1 TABLET(10 MG) BY MOUTH DAILY Burchette, Wolm ORN, MD  Active   lidocaine  (LIDODERM ) 5 % [id number] Yes Place 1 patch onto the skin. [provider]  Active   losartan -hydrochlorothiazide (HYZAAR) 50-12.5 MG tablet [id number] Yes TAKE 1 TABLET BY MOUTH DAILY Burchette, Wolm ORN, MD  Active   naproxen  (NAPROSYN ) 500 MG tablet [id number] Yes Take 1 tablet (500 mg total) by mouth 2 (two) times daily. Micheal Wolm ORN, MD  Active             Recommendation:   Continue Current Plan of Care  Follow Up  Plan:   Telephone follow up appointment date/time:  08/20/2023 at 0900 with Mliss Creed RN  Alan Ee, RN, BSN, CEN Population Health- Transition of Care Team.  [facility] [phone]

## 2023-08-18 ENCOUNTER — Other Ambulatory Visit (INDEPENDENT_AMBULATORY_CARE_PROVIDER_SITE_OTHER)

## 2023-08-18 ENCOUNTER — Ambulatory Visit (INDEPENDENT_AMBULATORY_CARE_PROVIDER_SITE_OTHER): Admitting: Orthopaedic Surgery

## 2023-08-18 DIAGNOSIS — S83241A Other tear of medial meniscus, current injury, right knee, initial encounter: Secondary | ICD-10-CM | POA: Diagnosis not present

## 2023-08-18 NOTE — Progress Notes (Signed)
 Office Visit Note   Patient: Eric Mann           Date of Birth: 1960-06-14           MRN: [id number] Visit Date: 08/18/2023              Requested by: Micheal Wolm ORN, MD [address] [place],  [place] [address] PCP: Micheal Wolm ORN, MD   Assessment & Plan: Visit Diagnoses:  1. Acute medial meniscus tear of right knee, initial encounter     Plan: History of Present Illness Eric Mann is a 63 year old male who presents with right knee pain following a recent hospitalization at [facility].  He experiences right knee pain with an MRI showing a medial meniscus tear and arthritis. A cortisone injection provided limited relief. Pain is localized to the medial side, with occasional sensation of the knee 'going backwards' after sitting. No lateral knee pain is present.  He now uses a walker for mobility, which was not needed before his recent hospitalization.  Physical Exam MUSCULOSKELETAL: Right knee with good flexibility, no effusion. Pain with palpation on medial side of right knee. No pain on lateral side of right knee, no locking or catching.  Results RADIOLOGY Right knee MRI: Meniscus tear, medial meniscus; moderate to severe arthritis (08/04/2023) Right knee x-ray: Osteophytes; joint spaces preserved (08/18/2023)  Assessment and Plan Medial meniscus tear, right knee MRI confirms medial meniscus tear, primary pain source. X-rays show no severe arthritis. Symptoms include medial pain and instability.  - Obtain MRI images for evaluation. - Discuss arthroscopic surgery to excise torn meniscus. - Ensure surgical clearance due to recent hospitalization.  Moderate to severe arthritis, right knee MRI shows moderate to severe arthritis. X-rays indicate preserved joint spaces with spurring. Arthritis contributes to knee degeneration but not primary pain source. - Consider gel injection if symptoms persist post-meniscus treatment.  Follow-Up Instructions: No  follow-ups on file.   Orders:  Orders Placed This Encounter  Procedures   XR KNEE 3 VIEW RIGHT   No orders of the defined types were placed in this encounter.     Procedures: No procedures performed   Clinical Data: No additional findings.   Subjective: Chief Complaint  Patient presents with   Right Knee - Pain    HPI  Review of Systems  Constitutional: Negative.   HENT: Negative.    Eyes: Negative.   Respiratory: Negative.    Cardiovascular: Negative.   Gastrointestinal: Negative.   Endocrine: Negative.   Genitourinary: Negative.   Skin: Negative.   Allergic/Immunologic: Negative.   Neurological: Negative.   Hematological: Negative.   Psychiatric/Behavioral: Negative.    All other systems reviewed and are negative.    Objective: Vital Signs: There were no vitals taken for this visit.  Physical Exam Vitals and nursing note reviewed.  Constitutional:      Appearance: He is well-developed.  HENT:     Head: Normocephalic and atraumatic.   Eyes:     Pupils: Pupils are equal, round, and reactive to light.   Pulmonary:     Effort: Pulmonary effort is normal.  Abdominal:     Palpations: Abdomen is soft.   Musculoskeletal:        General: Normal range of motion.     Cervical back: Neck supple.   Skin:    General: Skin is warm.   Neurological:     Mental Status: He is alert and oriented to person, place, and time.  Psychiatric:        Behavior: Behavior normal.        Thought Content: Thought content normal.        Judgment: Judgment normal.     Ortho Exam  Specialty Comments:  No specialty comments available.  Imaging: XR KNEE 3 VIEW RIGHT Result Date: 08/18/2023 X-rays of the right knee show mild tricompartment osteoarthritis with periarticular spurring.  Well-preserved joint spaces.    PMFS History: Patient Active Problem List   Diagnosis Date Noted   Knee effusion, left 05/06/2017   Metatarsal deformity 05/23/2014    Metatarsalgia of left foot 05/19/2014   Equinus deformity of foot, acquired 05/19/2014   Pronation deformity of both feet 05/19/2014   Obesity (BMI 30-39.9) 01/31/2014   Gout 04/30/2012   Essential hypertension, benign 04/30/2012   Other and unspecified hyperlipidemia 04/30/2012   Past Medical History:  Diagnosis Date   Arthritis    lt. knee   Glaucoma    not on medicine yet   Gout    Gout    Hyperlipidemia    Hypertension     Family History  Problem Relation Age of Onset   Heart disease Mother    Hypertension Mother    Diabetes Mother    Diabetes Sister    Cancer Sister        cervix cancer   Diabetes Brother    Colon cancer Neg Hx    Esophageal cancer Neg Hx    Rectal cancer Neg Hx    Stomach cancer Neg Hx    Colon polyps Neg Hx     Past Surgical History:  Procedure Laterality Date   COLONOSCOPY     KNEE ARTHROSCOPY Left 06/24/2017   POLYPECTOMY     Social History   Occupational History   Not on file  Tobacco Use   Smoking status: Never   Smokeless tobacco: Never  Vaping Use   Vaping status: Never Used  Substance and Sexual Activity   Alcohol use: No    Alcohol/week: 0.0 standard drinks of alcohol    Comment: wine occasionally   Drug use: No   Sexual activity: Yes

## 2023-08-19 ENCOUNTER — Telehealth: Payer: Self-pay | Admitting: Orthopaedic Surgery

## 2023-08-19 NOTE — Telephone Encounter (Signed)
 Mychart msg sent to patient advising of form protocol

## 2023-08-19 NOTE — Telephone Encounter (Signed)
 [facility] [phone], spoke to front desk in radiology. I requested MRI Rt knee 08/04/23 CD. She is to copy to CD and mail to our office. (The faxed request failed)

## 2023-08-19 NOTE — Telephone Encounter (Signed)
[employer] forms received. Please advise work status. Thank you!

## 2023-08-19 NOTE — Telephone Encounter (Signed)
 I have him work from now until 2 weeks after surgery.

## 2023-08-20 ENCOUNTER — Other Ambulatory Visit: Payer: Self-pay | Admitting: *Deleted

## 2023-08-20 NOTE — Patient Instructions (Signed)
 Visit Information  Thank you for taking time to visit with me today. Please don't hesitate to contact me if I can be of assistance to you before our next scheduled telephone appointment.  Our next appointment is by telephone on 08/27/23 @ 1015 am  Following is a copy of your care plan:   Goals Addressed             This Visit's Progress    VBCI Transitions of Care (TOC) Care Plan       Problems:  Recent Hospitalization for treatment of sepsis  06/27/205  Patient reports that he is doing well. No syncopal episodes since hospital discharge. Reports no new concerns today.  Continues to take his medications as prescribed.  No Hospital Follow Up Provider appointment 08/14/2023   PCP visit completed. 08/20/23- pt reports he is doing well, currently resting, has knee pain only with activity, continues using walker, denies any syncopal episodes, had MRI - pending results and will follow up with orthopedics  Goal:  Over the next 30 days, the patient will not experience hospital readmission  Interventions:  Transitions of Care: Doctor Visits  - discussed the importance of doctor visits. Pending MRI results and ortho follow up Medications reviewed and encouraged patient to take all medications as prescribed Advised to call primary care provider for any new/ ongoing concerns Advised to call EMS for severe symptoms Encouraged patient to continue to eat and drink well.  Reviewed safety precautions and importance of using walker Pain assessment completed, reviewed pain management strategies  Patient Self Care Activities:  Attend all scheduled provider appointments Call pharmacy for medication refills 3-7 days in advance of running out of medications Call provider office for new concerns or questions  Notify RN Care Manager of TOC call rescheduling needs Participate in Transition of Care Program/Attend TOC scheduled calls Perform all self care activities independently  Take medications as  prescribed   Call EMS for severe symptoms Follow a low salt diet  Use walker as directed  Plan:  Telephone follow up appointment with care management team member scheduled for:  08/27/23 @ 1015 am with Alan Ee RN The patient has been provided with contact information for the care management team and has been advised to call with any health related questions or concerns.         Patient verbalizes understanding of instructions and care plan provided today and agrees to view in MyChart. Active MyChart status and patient understanding of how to access instructions and care plan via MyChart confirmed with patient.     Telephone follow up appointment with care management team member scheduled for: 08/27/23 @ 1015 am  Please call the care guide team at [phone] if you need to cancel or reschedule your appointment.   Please call the Suicide and Crisis Lifeline: 988 call the [place]  National Suicide Prevention Lifeline: 226-812-1167 or TTY: 8574948698 TTY 575-861-2121) to talk to a trained counselor call 1-800-273-TALK (toll free, 24 hour hotline) call 911 if you are experiencing a Mental Health or Behavioral Health Crisis or need someone to talk to.  Mliss Creed [facility], BSN RN Care Manager/ Transition of Care [place] [phone]

## 2023-08-20 NOTE — Patient Outreach (Addendum)
 Transition of Care week 3  Visit Note  08/20/2023  Name: Eric Mann MRN: [id number]          DOB: February 24, 1960  Situation: Patient enrolled in [facility] 30-day program. Visit completed with patient by telephone.   Background:    Past Medical History:  Diagnosis Date   Arthritis    lt. knee   Glaucoma    not on medicine yet   Gout    Gout    Hyperlipidemia    Hypertension     Assessment: Patient Reported Symptoms: Cognitive Cognitive Status: No symptoms reported, Able to follow simple commands, Normal speech and language skills      Neurological Neurological Review of Symptoms: No symptoms reported    HEENT HEENT Symptoms Reported: No symptoms reported      Cardiovascular Cardiovascular Symptoms Reported: No symptoms reported    Respiratory Respiratory Symptoms Reported: No symptoms reported    Endocrine Endocrine Symptoms Reported: No symptoms reported    Gastrointestinal Gastrointestinal Symptoms Reported: No symptoms reported      Genitourinary Genitourinary Symptoms Reported: No symptoms reported    Integumentary Integumentary Symptoms Reported: No symptoms reported    Musculoskeletal Musculoskelatal Symptoms Reviewed: Other Other Musculoskeletal Symptoms: right knee pain- had MRI awaiting results and will follow up with orthopedics Additional Musculoskeletal Details: pt is using walker,  pain assessment completed Musculoskeletal Comment: Reviewed safety precautions      Psychosocial Psychosocial Symptoms Reported: No symptoms reported         There were no vitals filed for this visit.  Medications Reviewed Today     Reviewed by Aura Mliss LABOR, RN (Registered Nurse) on 08/20/23 at [phone]  Med List Status: <None>   Medication Order Taking? Sig Documenting Provider Last Dose Status Informant  allopurinol  (ZYLOPRIM ) 300 MG tablet [id number] Yes Take 1 tablet (300 mg total) by mouth daily. Micheal Wolm ORN, MD  Active   amLODipine  (NORVASC ) 5 MG tablet  [id number] Yes TAKE 1 TABLET(5 MG) BY MOUTH DAILY Burchette, Wolm ORN, MD  Active   atorvastatin  (LIPITOR) 40 MG tablet [id number] Yes TAKE 1 TABLET(40 MG) BY MOUTH DAILY Burchette, Wolm ORN, MD  Active   ciclopirox  (LOPROX ) 0.77 % cream [id number] Yes Apply topically 2 (two) times daily. Micheal Wolm ORN, MD  Active   colchicine  (COLCRYS ) 0.6 MG tablet [id number] Yes Take 1 tablet (0.6 mg total) by mouth daily. Micheal Wolm ORN, MD  Active   ezetimibe  (ZETIA ) 10 MG tablet [id number] Yes TAKE 1 TABLET(10 MG) BY MOUTH DAILY Burchette, Wolm ORN, MD  Active   lidocaine  (LIDODERM ) 5 % [id number] Yes Place 1 patch onto the skin. [provider]  Active   losartan -hydrochlorothiazide (HYZAAR) 50-12.5 MG tablet [id number] Yes TAKE 1 TABLET BY MOUTH DAILY Burchette, Wolm ORN, MD  Active   naproxen  (NAPROSYN ) 500 MG tablet [id number] Yes Take 1 tablet (500 mg total) by mouth 2 (two) times daily. Micheal Wolm ORN, MD  Active             Goals Addressed             This Visit's Progress    VBCI Transitions of Care (TOC) Care Plan       Problems:  Recent Hospitalization for treatment of sepsis  06/27/205  Patient reports that he is doing well. No syncopal episodes since hospital discharge. Reports no new concerns today.  Continues to take his medications as prescribed.  No Hospital Follow Up Provider appointment 08/14/2023   PCP visit completed.  08/20/23- pt reports he is doing well, currently resting, has knee pain only with activity, continues using walker, denies any syncopal episodes, had MRI - pending results and will follow up with orthopedics  Goal:  Over the next 30 days, the patient will not experience hospital readmission  Interventions:  Transitions of Care: Doctor Visits  - discussed the importance of doctor visits. Pending MRI results and ortho follow up Medications reviewed and encouraged patient to take all medications as prescribed Advised to call primary care provider for any  new/ ongoing concerns Advised to call EMS for severe symptoms Encouraged patient to continue to eat and drink well.  Reviewed safety precautions and importance of using walker Pain assessment completed, reviewed pain management strategies  Patient Self Care Activities:  Attend all scheduled provider appointments Call pharmacy for medication refills 3-7 days in advance of running out of medications Call provider office for new concerns or questions  Notify RN Care Manager of TOC call rescheduling needs Participate in Transition of Care Program/Attend TOC scheduled calls Perform all self care activities independently  Take medications as prescribed   Call EMS for severe symptoms Follow a low salt diet  Use walker as directed  Plan:  Telephone follow up appointment with care management team member scheduled for:  08/27/23 @ 1015 am with Alan Ee RN The patient has been provided with contact information for the care management team and has been advised to call with any health related questions or concerns.          Recommendation:   PCP Follow-up Specialty provider follow-up orthopedics  Follow Up Plan:   Telephone follow-up 08/27/23 @ 1015 am with Alan Ee RN  Mliss Creed [facility], BSN RN Care Manager/ Transition of Care / [facility] [phone]

## 2023-08-25 ENCOUNTER — Ambulatory Visit (INDEPENDENT_AMBULATORY_CARE_PROVIDER_SITE_OTHER): Admitting: Orthopaedic Surgery

## 2023-08-25 DIAGNOSIS — S83241A Other tear of medial meniscus, current injury, right knee, initial encounter: Secondary | ICD-10-CM

## 2023-08-25 NOTE — Progress Notes (Signed)
 Patient's appointment rescheduled.  We are waiting receipt of MRI scan.  I will call the patient to discuss further steps once I have the disc.

## 2023-08-27 ENCOUNTER — Other Ambulatory Visit: Payer: Self-pay

## 2023-08-27 NOTE — Patient Instructions (Signed)
 Visit Information  Thank you for taking time to visit with me today. Please don't hesitate to contact me if I can be of assistance to you before our next scheduled telephone appointment.  Our next appointment is by telephone on 09/04/2023 at 10 am  Following is a copy of your care plan:   Goals Addressed             This Visit's Progress    VBCI Transitions of Care (TOC) Care Plan       Problems:  Recent Hospitalization for treatment of sepsis  06/27/205  Patient reports that he is doing well. No syncopal episodes since hospital discharge. Reports no new concerns today.  Continues to take his medications as prescribed. 08/20/23- pt reports he is doing well, currently resting, has knee pain only with activity, continues using walker, denies any syncopal episodes, had MRI - pending results and will follow up with orthopedics 08/27/2023  Patient reports that he is doing well. Denies any recent syncopal episodes.  Reports  BP is fine  States that he is still waiting on the images from [facility] to be sent to ortho.  No Hospital Follow Up Provider appointment 08/14/2023   PCP visit completed.   Goal:  Over the next 30 days, the patient will not experience hospital readmission  Interventions:  Transitions of Care: Doctor Visits  - discussed the importance of doctor visits. Pending MRI results and ortho follow up Medications reviewed and encouraged patient to take all medications as prescribed Advised to call primary care provider for any new/ ongoing concerns Advised to call EMS for severe symptoms Encouraged patient to continue to eat and drink well.  Reinforced safety precautions and importance of using walker Pain assessment completed, reviewed pain management strategies  Patient Self Care Activities:  Attend all scheduled provider appointments Call pharmacy for medication refills 3-7 days in advance of running out of medications Call provider office for new concerns or questions   Notify RN Care Manager of TOC call rescheduling needs Participate in Transition of Care Program/Attend TOC scheduled calls Perform all self care activities independently  Take medications as prescribed   Call EMS for severe symptoms Follow a low salt diet  Use walker as directed  Plan:  Telephone follow up appointment with care management team member scheduled for:  09/04/23 @ 10 am with Shona Prow RN ( covering for primary [facility] nurse) The patient has been provided with contact information for the care management team and has been advised to call with any health related questions or concerns.         Patient verbalizes understanding of instructions and care plan provided today and agrees to view in MyChart. Active MyChart status and patient understanding of how to access instructions and care plan via MyChart confirmed with patient.     Telephone follow up appointment with care management team member scheduled for:  09/04/2023 at 10 am  Please call the care guide team at [phone] if you need to cancel or reschedule your appointment.   Please call the Suicide and Crisis Lifeline: 988 call the [place]  National Suicide Prevention Lifeline: 9120239315 or TTY: 719 005 9675 TTY 870-105-2525) to talk to a trained counselor call 1-800-273-TALK (toll free, 24 hour hotline) call 911 if you are experiencing a Mental Health or Behavioral Health Crisis or need someone to talk to.  Alan Ee, RN, BSN, CEN [employer]- Transition of Care Team.  [facility] [phone]

## 2023-08-27 NOTE — Transitions of Care (Post Inpatient/ED Visit) (Addendum)
 Transition of Care week 4  Visit Note  08/27/2023  Name: Eric Mann MRN: [id number]          DOB: [birth year]  Situation: Patient enrolled in [facility] 30-day program. Visit completed with pateitn by telephone.   Background: recent admission for syncopal episode and knee pain  Initial Transition Care Management Follow-up Telephone Call    Past Medical History:  Diagnosis Date   Arthritis    lt. knee   Glaucoma    not on medicine yet   Gout    Gout    Hyperlipidemia    Hypertension     Assessment: Patient reports that he is doing well. Reports no recent weak or passing out spells. States that he is waiting for MRI disc to be sent to ortho.  Continues to have knee pain and is using walker.  No new problems or concerns today.  Patient Reported Symptoms: Cognitive Cognitive Status: Able to follow simple commands, Alert and oriented to person, place, and time, Normal speech and language skills      Neurological Neurological Review of Symptoms: No symptoms reported    HEENT HEENT Symptoms Reported: No symptoms reported      Cardiovascular Cardiovascular Symptoms Reported: No symptoms reported    Respiratory Respiratory Symptoms Reported: No symptoms reported    Endocrine Endocrine Symptoms Reported: No symptoms reported    Gastrointestinal Gastrointestinal Symptoms Reported: No symptoms reported      Genitourinary Genitourinary Symptoms Reported: No symptoms reported    Integumentary Integumentary Symptoms Reported: No symptoms reported    Musculoskeletal Other Musculoskeletal Symptoms: continues to have right knee pain. waiting for MRI results and disc to be sent to ortho Additional Musculoskeletal Details: using his falls,  pain 3/10 today Musculoskeletal Management Strategies: Medication therapy Musculoskeletal Self-Management Outcome: 3 (uncertain) Musculoskeletal Comment: encouraged patient to continue to use his walker and be safe.      Psychosocial Psychosocial  Symptoms Reported: No symptoms reported         Today's Vitals   08/27/23 1022  PainSc: 3       Medications Reviewed Today     Reviewed by Rumalda Alan PENNER, RN (Registered Nurse) on 08/27/23 at 1017  Med List Status: <None>   Medication Order Taking? Sig Documenting Provider Last Dose Status Informant  allopurinol  (ZYLOPRIM ) 300 MG tablet [id number] Yes Take 1 tablet (300 mg total) by mouth daily. Micheal Wolm ORN, MD  Active   amLODipine  (NORVASC ) 5 MG tablet [id number] Yes TAKE 1 TABLET(5 MG) BY MOUTH DAILY Micheal Wolm ORN, MD  Active   atorvastatin  (LIPITOR) 40 MG tablet [id number] Yes TAKE 1 TABLET(40 MG) BY MOUTH DAILY Burchette, Wolm ORN, MD  Active   ciclopirox  (LOPROX ) 0.77 % cream [id number] Yes Apply topically 2 (two) times daily. Micheal Wolm ORN, MD  Active   colchicine  (COLCRYS ) 0.6 MG tablet [id number] Yes Take 1 tablet (0.6 mg total) by mouth daily. Micheal Wolm ORN, MD  Active   ezetimibe  (ZETIA ) 10 MG tablet [id number] Yes TAKE 1 TABLET(10 MG) BY MOUTH DAILY Burchette, Wolm ORN, MD  Active   lidocaine  (LIDODERM ) 5 % [id number] Yes Place 1 patch onto the skin. [provider]  Active   losartan -hydrochlorothiazide (HYZAAR) 50-12.5 MG tablet [id number] Yes TAKE 1 TABLET BY MOUTH DAILY Burchette, Wolm ORN, MD  Active   naproxen  (NAPROSYN ) 500 MG tablet [id number] Yes Take 1 tablet (500 mg total) by mouth 2 (two) times daily. Micheal Wolm ORN, MD  Active  Recommendation:   Continue Current Plan of Care  Follow Up Plan:   Telephone follow up appointment date/time:  09/04/2023 at 10 am  with Shona Prow RN [facility] team)  Alan Ee, RN, BSN, CEN Population Health- Transition of Care Team.  [facility] [phone]

## 2023-09-04 ENCOUNTER — Other Ambulatory Visit: Payer: Self-pay

## 2023-09-04 NOTE — Transitions of Care (Post Inpatient/ED Visit) (Signed)
 Transition of Care Week #5 Closure Note  Visit Note  09/04/2023  Name: Eric Mann MRN: [id number]          DOB: [birth year]  Situation: Patient enrolled in [facility] 30-day program. Visit completed with patient by telephone.   Background:   Initial Transition Care Management Follow-up Telephone Call    Past Medical History:  Diagnosis Date   Arthritis    lt. knee   Glaucoma    not on medicine yet   Gout    Gout    Hyperlipidemia    Hypertension     Assessment: Patient Reported Symptoms: Cognitive Cognitive Status: No symptoms reported, Normal speech and language skills, Alert and oriented to person, place, and time      Neurological Neurological Review of Symptoms: No symptoms reported    HEENT HEENT Symptoms Reported: No symptoms reported      Cardiovascular Cardiovascular Symptoms Reported: No symptoms reported    Respiratory Respiratory Symptoms Reported: No symptoms reported    Endocrine Endocrine Symptoms Reported: No symptoms reported    Gastrointestinal Gastrointestinal Symptoms Reported: No symptoms reported Additional Gastrointestinal Details: patient states n/v has resolved and states he is moving his bowels and denies abdominal pain      Genitourinary Genitourinary Symptoms Reported: No symptoms reported    Integumentary Integumentary Symptoms Reported: No symptoms reported    Musculoskeletal Musculoskelatal Symptoms Reviewed: No symptoms reported Other Musculoskeletal Symptoms: patient states MD received MRI results and he's awaiting auth from insurance and scheduling surgery Additional Musculoskeletal Details: Patient denies pain at time of call        Psychosocial Psychosocial Symptoms Reported: No symptoms reported         There were no vitals filed for this visit.  Medications Reviewed Today     Reviewed by Lauro Shona LABOR, RN (Registered Nurse) on 09/04/23 at 1008  Med List Status: <None>   Medication Order Taking? Sig Documenting  Provider Last Dose Status Informant  allopurinol  (ZYLOPRIM ) 300 MG tablet [id number] Yes Take 1 tablet (300 mg total) by mouth daily. Micheal Wolm ORN, MD  Active   amLODipine  (NORVASC ) 5 MG tablet [id number] Yes TAKE 1 TABLET(5 MG) BY MOUTH DAILY Burchette, Wolm ORN, MD  Active   atorvastatin  (LIPITOR) 40 MG tablet [id number] Yes TAKE 1 TABLET(40 MG) BY MOUTH DAILY Burchette, Wolm ORN, MD  Active   ciclopirox  (LOPROX ) 0.77 % cream [id number]  Apply topically 2 (two) times daily.  Patient not taking: Reported on 09/04/2023   Micheal Wolm ORN, MD  Active   colchicine  (COLCRYS ) 0.6 MG tablet [id number]  Take 1 tablet (0.6 mg total) by mouth daily.  Patient not taking: Reported on 09/04/2023   Micheal Wolm ORN, MD  Active   ezetimibe  (ZETIA ) 10 MG tablet [id number]  TAKE 1 TABLET(10 MG) BY MOUTH DAILY  Patient not taking: Reported on 09/04/2023   Micheal Wolm ORN, MD  Active   lidocaine  (LIDODERM ) 5 % [id number] Yes Place 1 patch onto the skin. [provider]  Active   losartan -hydrochlorothiazide (HYZAAR) 50-12.5 MG tablet [id number]  TAKE 1 TABLET BY MOUTH DAILY  Patient not taking: Reported on 09/04/2023   Micheal Wolm ORN, MD  Active   naproxen  (NAPROSYN ) 500 MG tablet [id number]  Take 1 tablet (500 mg total) by mouth 2 (two) times daily.  Patient not taking: Reported on 09/04/2023   Micheal Wolm ORN, MD  Active             Recommendation:   Closing TOC  program  Follow Up Plan:   Closing From:  Transitions of Care Program  Shona Prow RN, CCM [facility]  VBCI-Population Health RN Care Manager [phone]

## 2023-09-04 NOTE — Patient Instructions (Signed)
 Visit Information  Thank you for taking time to visit with me today and for participating in the [facility] Program.   Following is a copy of your care plan:   Goals Addressed             This Visit's Progress    VBCI Transitions of Care (TOC) Care Plan       Problems: Patient feels he is ready to close [facility] [employer] Program and denied CCM - at this he is waiting auth/schedule for Right knee (meniscus) surgery  Recent Hospitalization for treatment of sepsis  06/27/205  Patient reports that he is doing well. No syncopal episodes since hospital discharge. Reports no new concerns today.  Continues to take his medications as prescribed. 08/20/23- pt reports he is doing well, currently resting, has knee pain only with activity, continues using walker, denies any syncopal episodes, had MRI - pending results and will follow up with orthopedics 08/27/2023  Patient reports that he is doing well. Denies any recent syncopal episodes.  Reports  BP is fine  States that he is still waiting on the images from [facility] to be sent to ortho.  No Hospital Follow Up Provider appointment 08/14/2023   PCP visit completed.   Goal:  Over the next 30 days, the patient will not experience hospital readmission  Interventions:  Transitions of Care: Doctor Visits  - discussed the importance of doctor visits. Pending MRI results and ortho follow up Medications reviewed and encouraged patient to take all medications as prescribed Advised to call primary care provider for any new/ ongoing concerns Advised to call EMS for severe symptoms Encouraged patient to continue to eat and drink well.  Reinforced safety precautions and importance of using walker Pain assessment completed, reviewed pain management strategies  Patient Self Care Activities:  Attend all scheduled provider appointments Call pharmacy for medication refills 3-7 days in advance of running out of medications Call provider office for new concerns or questions  Notify RN  Care Manager of TOC call rescheduling needs Participate in Transition of Care Program/Attend TOC scheduled calls Perform all self care activities independently  Take medications as prescribed   Call EMS for severe symptoms Follow a low salt diet  Use walker as directed  Plan:  Patient feels he is ready to close [facility] Program and denied CCM - at this he is waiting auth/schedule for Right knee (meniscus) surgery         Patient verbalizes understanding of instructions and care plan provided today and agrees to view in MyChart. Active MyChart status and patient understanding of how to access instructions and care plan via MyChart confirmed with patient.     The patient has been provided with contact information for the care management team and has been advised to call with any health related questions or concerns.   Please call the care guide team at [phone] if you need to cancel or reschedule your appointment.   Please call the Suicide and Crisis Lifeline: 988 call 1-800-273-TALK (toll free, 24 hour hotline) call 911 if you are experiencing a Mental Health or Behavioral Health Crisis or need someone to talk to.  Shona Prow RN, CCM [place]  VBCI-Population Health RN Care Manager [phone]

## 2023-09-08 ENCOUNTER — Telehealth: Payer: Self-pay

## 2023-09-08 NOTE — Telephone Encounter (Signed)
 Form was previously faxed on 08/25/2023 and was faxed again today with Confirmation stating status of sent

## 2023-09-08 NOTE — Telephone Encounter (Signed)
 Copied from CRM [id number]. Topic: General - Other >> Sep 08, 2023  1:58 PM Burnard DEL wrote: Reason for CRM: Patient called in to let provider know that orthopedic surgeon faxed over a surgical clearance for provider to completed. Patient would like to know if provider could complete and send back as soon as possible so that he could get scheduled for his surgery.

## 2023-09-14 ENCOUNTER — Other Ambulatory Visit: Payer: Self-pay | Admitting: Physician Assistant

## 2023-09-14 MED ORDER — HYDROCODONE-ACETAMINOPHEN 5-325 MG PO TABS: 1.0000 | ORAL_TABLET | Freq: Three times a day (TID) | ORAL | 0 refills | Status: AC | PRN

## 2023-09-14 MED ORDER — ONDANSETRON HCL 4 MG PO TABS: 4.0000 mg | ORAL_TABLET | Freq: Three times a day (TID) | ORAL | 0 refills | Status: AC | PRN

## 2023-09-23 ENCOUNTER — Other Ambulatory Visit: Payer: Self-pay | Admitting: Physician Assistant

## 2023-09-24 ENCOUNTER — Encounter: Payer: Self-pay | Admitting: Orthopaedic Surgery

## 2023-09-24 DIAGNOSIS — S83231A Complex tear of medial meniscus, current injury, right knee, initial encounter: Secondary | ICD-10-CM | POA: Diagnosis not present

## 2023-09-24 DIAGNOSIS — M94261 Chondromalacia, right knee: Secondary | ICD-10-CM | POA: Diagnosis not present

## 2023-10-02 ENCOUNTER — Ambulatory Visit (INDEPENDENT_AMBULATORY_CARE_PROVIDER_SITE_OTHER): Admitting: Orthopaedic Surgery

## 2023-10-02 DIAGNOSIS — M1711 Unilateral primary osteoarthritis, right knee: Secondary | ICD-10-CM

## 2023-10-02 DIAGNOSIS — S83241A Other tear of medial meniscus, current injury, right knee, initial encounter: Secondary | ICD-10-CM

## 2023-10-02 MED ORDER — TRAMADOL HCL 50 MG PO TABS: 50.0000 mg | ORAL_TABLET | Freq: Every day | ORAL | 0 refills | Status: AC | PRN

## 2023-10-02 NOTE — Progress Notes (Signed)
 Post-Op Visit Note   Patient: Eric Mann           Date of Birth: 1960-12-01           MRN: [id number] Visit Date: 10/02/2023 PCP: Micheal Wolm ORN, MD   Assessment & Plan:  Chief Complaint:  Chief Complaint  Patient presents with   Right Knee - Follow-up    Right knee scope 09/24/2023   Visit Diagnoses:  1. Acute medial meniscus tear of right knee, initial encounter   2. Primary osteoarthritis of right knee     Plan: History of Present Illness Eric Mann is a 63 year old male who presents for 1 week follow-up after knee arthroscopy. He is accompanied by his daughter and granddaughter.  He experiences knee soreness, which is manageable, along with stiffness and swelling, particularly following the recent arthroscopy. He inquires about using Tylenol  for pain management but is informed it lacks anti-inflammatory properties. He finds his current pain medication helpful at night to improve sleep.  Right knee exam shows healed surgical incisions.  No signs of infection.  Range of motion is progressing.  Assessment and Plan Post-operative recovery following right knee meniscus surgery Soreness and stiffness expected post-surgery. Swelling and stiffness due to procedure, pain manageable. Aim for pain relief and functional improvement via rehab and medication. - Provide flexibility and strength exercises, start easy, progress difficulty. - Recommend anti-inflammatory medications like Advil  or Aleve . - Prescribe tramadol  for pain, sent to [facility]. - Advise removal of surgical strips. - Instruct to avoid strenuous activities like running or jumping. - Encourage gradual activity increase based on comfort and progress.  Right knee osteoarthritis Chronic osteoarthritis with cartilage wear under kneecap and femur. Symptoms manageable, future knee replacement possible if worsens. - Discuss potential knee replacement if symptoms worsen.  Follow-Up  Instructions: Return in about 4 weeks (around 10/30/2023) for with lindsey.   Orders:  No orders of the defined types were placed in this encounter.  Meds ordered this encounter  Medications   traMADol  (ULTRAM ) 50 MG tablet    Sig: Take 1-2 tablets (50-100 mg total) by mouth daily as needed.    Dispense:  14 tablet    Refill:  0    Imaging: No results found.  PMFS History: Patient Active Problem List   Diagnosis Date Noted   Knee effusion, left 05/06/2017   Metatarsal deformity 05/23/2014   Metatarsalgia of left foot 05/19/2014   Equinus deformity of foot, acquired 05/19/2014   Pronation deformity of both feet 05/19/2014   Obesity (BMI 30-39.9) 01/31/2014   Gout 04/30/2012   Essential hypertension, benign 04/30/2012   Other and unspecified hyperlipidemia 04/30/2012   Past Medical History:  Diagnosis Date   Arthritis    lt. knee   Glaucoma    not on medicine yet   Gout    Gout    Hyperlipidemia    Hypertension     Family History  Problem Relation Age of Onset   Heart disease Mother    Hypertension Mother    Diabetes Mother    Diabetes Sister    Cancer Sister        cervix cancer   Diabetes Brother    Colon cancer Neg Hx    Esophageal cancer Neg Hx    Rectal cancer Neg Hx    Stomach cancer Neg Hx    Colon polyps Neg Hx     Past Surgical History:  Procedure Laterality Date   COLONOSCOPY  KNEE ARTHROSCOPY Left 06/24/2017   POLYPECTOMY     Social History   Occupational History   Not on file  Tobacco Use   Smoking status: Never   Smokeless tobacco: Never  Vaping Use   Vaping status: Never Used  Substance and Sexual Activity   Alcohol use: No    Alcohol/week: 0.0 standard drinks of alcohol    Comment: wine occasionally   Drug use: No   Sexual activity: Yes

## 2023-10-19 DEATH — deceased

## 2023-10-30 ENCOUNTER — Encounter: Admitting: Physician Assistant

## 2023-12-21 ENCOUNTER — Encounter: Payer: Self-pay | Admitting: Radiology
